# Patient Record
Sex: Male | Born: 1935 | Race: Black or African American | Hispanic: No | Smoking: Never smoker
Health system: Southern US, Community
[De-identification: ages and names within clinical notes are randomized; demographics above are authoritative.]

---

## 2018-06-22 ENCOUNTER — Ambulatory Visit (INDEPENDENT_AMBULATORY_CARE_PROVIDER_SITE_OTHER): Payer: Self-pay | Admitting: Orthopaedic Surgery

## 2018-07-06 ENCOUNTER — Ambulatory Visit (INDEPENDENT_AMBULATORY_CARE_PROVIDER_SITE_OTHER): Payer: Self-pay | Admitting: Orthopaedic Surgery

## 2018-07-13 ENCOUNTER — Ambulatory Visit (INDEPENDENT_AMBULATORY_CARE_PROVIDER_SITE_OTHER): Payer: Self-pay | Admitting: Orthopaedic Surgery

## 2018-08-16 ENCOUNTER — Ambulatory Visit (INDEPENDENT_AMBULATORY_CARE_PROVIDER_SITE_OTHER): Payer: No Typology Code available for payment source | Admitting: Orthopaedic Surgery

## 2018-08-16 ENCOUNTER — Ambulatory Visit (INDEPENDENT_AMBULATORY_CARE_PROVIDER_SITE_OTHER): Payer: PRIVATE HEALTH INSURANCE

## 2018-08-16 ENCOUNTER — Encounter (INDEPENDENT_AMBULATORY_CARE_PROVIDER_SITE_OTHER): Payer: Self-pay | Admitting: Orthopaedic Surgery

## 2018-08-16 DIAGNOSIS — G8929 Other chronic pain: Secondary | ICD-10-CM | POA: Diagnosis not present

## 2018-08-16 DIAGNOSIS — M25561 Pain in right knee: Secondary | ICD-10-CM | POA: Diagnosis not present

## 2018-08-16 NOTE — Progress Notes (Signed)
Office Visit Note   Patient: Stephen Hester           Date of Birth: 01/14/1929           MRN: [id number] Visit Date: 08/16/2018              Requested by: No referring provider defined for this encounter. PCP: Patient, No Pcp Per   Assessment & Plan: Visit Diagnoses:  1. Chronic pain of right knee     Plan: Impression is right knee arthritis exacerbation with reactive effusion.  Patient was offered an aspiration and injection but he was unable to make decisions for himself today.  His daughter was not present today.  They will follow-up at a later time to have this done.  In the meantime we have given him a hinged knee brace for support.  He will continue to rest and ice as needed. Total face to face encounter time was greater than 45 minutes and over half of this time was spent in counseling and/or coordination of care.  Follow-Up Instructions: No follow-ups on file.   Orders:  Orders Placed This Encounter  Procedures  . XR KNEE 3 VIEW RIGHT   No orders of the defined types were placed in this encounter.     Procedures: No procedures performed   Clinical Data: No additional findings.   Subjective: Chief Complaint  Patient presents with  . Right Knee - Pain    Stephen Hester is a 37 year old gentleman who presents today with his son-in-law for chronic right knee pain for approximately a year.  He complains of throbbing aching pain that causes his leg to feel like it wants to give out.  He denies any injuries.  He ambulates with a Rollator.  He really does not provide much information because of either mild dementia or trouble with memory.  His healthcare power of attorney is his daughter.   Review of Systems  Constitutional: Negative.   All other systems reviewed and are negative.    Objective: Vital Signs: There were no vitals taken for this visit.  Physical Exam Vitals signs and nursing note reviewed.  Constitutional:      Appearance: He is well-developed.  HENT:     Head: Normocephalic and atraumatic.  Eyes:     Pupils: Pupils are equal, round, and reactive to light.  Neck:     Musculoskeletal: Neck supple.  Pulmonary:     Effort: Pulmonary effort is normal.  Abdominal:     Palpations: Abdomen is soft.  Musculoskeletal: Normal range of motion.  Skin:    General: Skin is warm.  Neurological:     Mental Status: He is alert and oriented to person, place, and time.  Psychiatric:        Behavior: Behavior normal.        Thought Content: Thought content normal.        Judgment: Judgment normal.     Ortho Exam Right knee exam shows a small joint effusion.  Collaterals and cruciates are stable.  Normal range of motion. Specialty Comments:  No specialty comments available.  Imaging: Xr Knee 3 View Right  Result Date: 08/16/2018 Mild to moderate joint space narrowing consistent with osteoarthritis    PMFS History: There are no active problems to display for this patient.  History reviewed. No pertinent past medical history.  History reviewed. No pertinent family history.  History reviewed. No pertinent surgical history. Social History   Occupational History  . Not on file  Tobacco  Use  . Smoking status: Never Smoker  . Smokeless tobacco: Never Used  Substance and Sexual Activity  . Alcohol use: Not on file  . Drug use: Not on file  . Sexual activity: Not on file

## 2018-08-17 ENCOUNTER — Ambulatory Visit (INDEPENDENT_AMBULATORY_CARE_PROVIDER_SITE_OTHER): Payer: Self-pay | Admitting: Orthopaedic Surgery

## 2018-08-31 ENCOUNTER — Ambulatory Visit (INDEPENDENT_AMBULATORY_CARE_PROVIDER_SITE_OTHER): Payer: PRIVATE HEALTH INSURANCE | Admitting: Orthopaedic Surgery

## 2018-09-07 ENCOUNTER — Encounter (INDEPENDENT_AMBULATORY_CARE_PROVIDER_SITE_OTHER): Payer: Self-pay | Admitting: Orthopaedic Surgery

## 2018-09-07 ENCOUNTER — Ambulatory Visit (INDEPENDENT_AMBULATORY_CARE_PROVIDER_SITE_OTHER): Payer: PRIVATE HEALTH INSURANCE | Admitting: Orthopaedic Surgery

## 2018-09-07 DIAGNOSIS — G8929 Other chronic pain: Secondary | ICD-10-CM | POA: Diagnosis not present

## 2018-09-07 DIAGNOSIS — M1711 Unilateral primary osteoarthritis, right knee: Secondary | ICD-10-CM

## 2018-09-07 DIAGNOSIS — M25561 Pain in right knee: Secondary | ICD-10-CM | POA: Diagnosis not present

## 2018-09-07 MED ORDER — LIDOCAINE HCL 1 % IJ SOLN
2.0000 mL | INTRAMUSCULAR | Status: AC | PRN
  Administered 2018-09-07: 2 mL

## 2018-09-07 MED ORDER — METHYLPREDNISOLONE ACETATE 40 MG/ML IJ SUSP
40.0000 mg | INTRAMUSCULAR | Status: AC | PRN
  Administered 2018-09-07: 40 mg via INTRA_ARTICULAR

## 2018-09-07 MED ORDER — BUPIVACAINE HCL 0.25 % IJ SOLN
2.0000 mL | INTRAMUSCULAR | Status: AC | PRN
  Administered 2018-09-07: 2 mL via INTRA_ARTICULAR

## 2018-09-07 NOTE — Progress Notes (Signed)
   Office Visit Note   Patient: Stephen Hester           Date of Birth: 1928/09/21           MRN: [id number] Visit Date: 09/07/2018              Requested by: No referring provider defined for this encounter. PCP: Patient, No Pcp Per   Assessment & Plan: Visit Diagnoses:  1. Chronic pain of right knee     Plan: Impression is right knee osteoarthritis.  Do not believe the patient has enough of an effusion to aspirate his knee today, however we did inject it with cortisone.  We discussed viscosupplementation injection should the cortisone injection failed to relieve his symptoms.  He will follow-up with [place] as needed.  This was all discussed with his daughter who is his power of attorney.  Follow-Up Instructions: Return if symptoms worsen or fail to improve.   Orders:  Orders Placed This Encounter  Procedures  . Large Joint Inj: R knee   No orders of the defined types were placed in this encounter.     Procedures: Large Joint Inj: R knee on 09/07/2018 1:37 PM Indications: pain Details: 22 G needle, anterolateral approach Medications: 2 mL lidocaine 1 %; 2 mL bupivacaine 0.25 %; 40 mg methylPREDNISolone acetate 40 MG/ML      Clinical Data: No additional findings.   Subjective: Chief Complaint  Patient presents with  . Right Knee - Pain, Follow-up    HPI Stephen Hester is a pleasant 85 year old gentleman who presents to our clinic today with his daughter who is his power of attorney.  History of osteoarthritis to the right knee.  He was seen in our office for this just a few weeks ago.  He has been having constant throbbing pain to the right knee.  Now also occurring at rest.  He has decided along with his daughter that it is best to try an intra-articular cortisone injection to help with the pain.  Review of Systems as detailed in HPI.  All others reviewed and are negative   Objective: Vital Signs: There were no vitals taken for this visit.  Physical Exam.  Well-developed and  well-nourished gentleman in no acute distress.  Alert and oriented x3.  Ortho Exam stable exam of the right knee.  Trace effusion.  Specialty Comments:  No specialty comments available.  Imaging: No new imaging   PMFS History: Patient Active Problem List   Diagnosis Date Noted  . Chronic pain of right knee 09/07/2018   History reviewed. No pertinent past medical history.  History reviewed. No pertinent family history.  History reviewed. No pertinent surgical history. Social History   Occupational History  . Not on file  Tobacco Use  . Smoking status: Never Smoker  . Smokeless tobacco: Never Used  Substance and Sexual Activity  . Alcohol use: Not on file  . Drug use: Not on file  . Sexual activity: Not on file

## 2019-02-08 ENCOUNTER — Emergency Department (HOSPITAL_COMMUNITY): Payer: Medicare PPO

## 2019-02-08 ENCOUNTER — Inpatient Hospital Stay (HOSPITAL_COMMUNITY)
Admission: EM | Admit: 2019-02-08 | Discharge: 2019-02-25 | DRG: 193 | Disposition: E | Payer: Medicare PPO | Attending: Internal Medicine | Admitting: Internal Medicine

## 2019-02-08 DIAGNOSIS — E1110 Type 2 diabetes mellitus with ketoacidosis without coma: Secondary | ICD-10-CM | POA: Diagnosis present

## 2019-02-08 DIAGNOSIS — I480 Paroxysmal atrial fibrillation: Secondary | ICD-10-CM | POA: Diagnosis not present

## 2019-02-08 DIAGNOSIS — Y92129 Unspecified place in nursing home as the place of occurrence of the external cause: Secondary | ICD-10-CM

## 2019-02-08 DIAGNOSIS — E870 Hyperosmolality and hypernatremia: Secondary | ICD-10-CM | POA: Diagnosis present

## 2019-02-08 DIAGNOSIS — G9341 Metabolic encephalopathy: Secondary | ICD-10-CM | POA: Diagnosis present

## 2019-02-08 DIAGNOSIS — Z781 Physical restraint status: Secondary | ICD-10-CM

## 2019-02-08 DIAGNOSIS — Z515 Encounter for palliative care: Secondary | ICD-10-CM

## 2019-02-08 DIAGNOSIS — E1169 Type 2 diabetes mellitus with other specified complication: Secondary | ICD-10-CM

## 2019-02-08 DIAGNOSIS — E785 Hyperlipidemia, unspecified: Secondary | ICD-10-CM | POA: Diagnosis present

## 2019-02-08 DIAGNOSIS — E876 Hypokalemia: Secondary | ICD-10-CM | POA: Diagnosis not present

## 2019-02-08 DIAGNOSIS — D638 Anemia in other chronic diseases classified elsewhere: Secondary | ICD-10-CM | POA: Diagnosis present

## 2019-02-08 DIAGNOSIS — D72829 Elevated white blood cell count, unspecified: Secondary | ICD-10-CM

## 2019-02-08 DIAGNOSIS — R7989 Other specified abnormal findings of blood chemistry: Secondary | ICD-10-CM | POA: Diagnosis present

## 2019-02-08 DIAGNOSIS — Z7189 Other specified counseling: Secondary | ICD-10-CM

## 2019-02-08 DIAGNOSIS — Z7901 Long term (current) use of anticoagulants: Secondary | ICD-10-CM

## 2019-02-08 DIAGNOSIS — R739 Hyperglycemia, unspecified: Secondary | ICD-10-CM

## 2019-02-08 DIAGNOSIS — G9340 Encephalopathy, unspecified: Secondary | ICD-10-CM | POA: Diagnosis not present

## 2019-02-08 DIAGNOSIS — I700 Atherosclerosis of aorta: Secondary | ICD-10-CM | POA: Diagnosis present

## 2019-02-08 DIAGNOSIS — R0600 Dyspnea, unspecified: Secondary | ICD-10-CM

## 2019-02-08 DIAGNOSIS — D696 Thrombocytopenia, unspecified: Secondary | ICD-10-CM | POA: Clinically undetermined

## 2019-02-08 DIAGNOSIS — Y92009 Unspecified place in unspecified non-institutional (private) residence as the place of occurrence of the external cause: Secondary | ICD-10-CM

## 2019-02-08 DIAGNOSIS — Z79899 Other long term (current) drug therapy: Secondary | ICD-10-CM

## 2019-02-08 DIAGNOSIS — Z66 Do not resuscitate: Secondary | ICD-10-CM | POA: Diagnosis not present

## 2019-02-08 DIAGNOSIS — J189 Pneumonia, unspecified organism: Secondary | ICD-10-CM | POA: Diagnosis not present

## 2019-02-08 DIAGNOSIS — D649 Anemia, unspecified: Secondary | ICD-10-CM | POA: Diagnosis present

## 2019-02-08 DIAGNOSIS — W19XXXA Unspecified fall, initial encounter: Secondary | ICD-10-CM | POA: Diagnosis present

## 2019-02-08 DIAGNOSIS — J9601 Acute respiratory failure with hypoxia: Secondary | ICD-10-CM | POA: Diagnosis not present

## 2019-02-08 DIAGNOSIS — R569 Unspecified convulsions: Secondary | ICD-10-CM

## 2019-02-08 DIAGNOSIS — R778 Other specified abnormalities of plasma proteins: Secondary | ICD-10-CM

## 2019-02-08 DIAGNOSIS — E860 Dehydration: Secondary | ICD-10-CM | POA: Diagnosis present

## 2019-02-08 DIAGNOSIS — R9431 Abnormal electrocardiogram [ECG] [EKG]: Secondary | ICD-10-CM

## 2019-02-08 DIAGNOSIS — I7389 Other specified peripheral vascular diseases: Secondary | ICD-10-CM | POA: Diagnosis present

## 2019-02-08 DIAGNOSIS — I119 Hypertensive heart disease without heart failure: Secondary | ICD-10-CM | POA: Diagnosis present

## 2019-02-08 DIAGNOSIS — Z20828 Contact with and (suspected) exposure to other viral communicable diseases: Secondary | ICD-10-CM | POA: Diagnosis present

## 2019-02-08 DIAGNOSIS — Z8673 Personal history of transient ischemic attack (TIA), and cerebral infarction without residual deficits: Secondary | ICD-10-CM

## 2019-02-08 DIAGNOSIS — R4182 Altered mental status, unspecified: Secondary | ICD-10-CM

## 2019-02-08 DIAGNOSIS — I959 Hypotension, unspecified: Secondary | ICD-10-CM | POA: Diagnosis not present

## 2019-02-08 DIAGNOSIS — I10 Essential (primary) hypertension: Secondary | ICD-10-CM

## 2019-02-08 DIAGNOSIS — R0602 Shortness of breath: Secondary | ICD-10-CM

## 2019-02-08 DIAGNOSIS — R509 Fever, unspecified: Secondary | ICD-10-CM

## 2019-02-08 DIAGNOSIS — R062 Wheezing: Secondary | ICD-10-CM

## 2019-02-08 DIAGNOSIS — E878 Other disorders of electrolyte and fluid balance, not elsewhere classified: Secondary | ICD-10-CM | POA: Diagnosis present

## 2019-02-08 DIAGNOSIS — G3183 Dementia with Lewy bodies: Secondary | ICD-10-CM

## 2019-02-08 DIAGNOSIS — R5381 Other malaise: Secondary | ICD-10-CM

## 2019-02-08 DIAGNOSIS — R21 Rash and other nonspecific skin eruption: Secondary | ICD-10-CM | POA: Diagnosis not present

## 2019-02-08 DIAGNOSIS — N179 Acute kidney failure, unspecified: Secondary | ICD-10-CM

## 2019-02-08 DIAGNOSIS — T827XXA Infection and inflammatory reaction due to other cardiac and vascular devices, implants and grafts, initial encounter: Secondary | ICD-10-CM

## 2019-02-08 DIAGNOSIS — Z794 Long term (current) use of insulin: Secondary | ICD-10-CM

## 2019-02-08 DIAGNOSIS — F0280 Dementia in other diseases classified elsewhere without behavioral disturbance: Secondary | ICD-10-CM | POA: Diagnosis present

## 2019-02-08 DIAGNOSIS — I4811 Longstanding persistent atrial fibrillation: Secondary | ICD-10-CM

## 2019-02-08 LAB — CBC
HCT: 36.8 % — ABNORMAL LOW (ref 39.0–52.0)
Hemoglobin: 11.5 g/dL — ABNORMAL LOW (ref 13.0–17.0)
MCH: 31.3 pg (ref 26.0–34.0)
MCHC: 31.3 g/dL (ref 30.0–36.0)
MCV: 100.3 fL — ABNORMAL HIGH (ref 80.0–100.0)
Platelets: 115 10*3/uL — ABNORMAL LOW (ref 150–400)
RBC: 3.67 MIL/uL — ABNORMAL LOW (ref 4.22–5.81)
RDW: 13.1 % (ref 11.5–15.5)
WBC: 7.7 10*3/uL (ref 4.0–10.5)
nRBC: 0 % (ref 0.0–0.2)

## 2019-02-08 LAB — BASIC METABOLIC PANEL
Anion gap: 16 — ABNORMAL HIGH (ref 5–15)
Anion gap: 10 (ref 5–15)
BUN: 60 mg/dL — ABNORMAL HIGH (ref 8–23)
BUN: 63 mg/dL — ABNORMAL HIGH (ref 8–23)
CO2: 17 mmol/L — ABNORMAL LOW (ref 22–32)
CO2: 23 mmol/L (ref 22–32)
Calcium: 9.2 mg/dL (ref 8.9–10.3)
Calcium: 8.9 mg/dL (ref 8.9–10.3)
Chloride: 119 mmol/L — ABNORMAL HIGH (ref 98–111)
Chloride: 118 mmol/L — ABNORMAL HIGH (ref 98–111)
Creatinine, Ser: 2.99 mg/dL — ABNORMAL HIGH (ref 0.61–1.24)
Creatinine, Ser: 2.75 mg/dL — ABNORMAL HIGH (ref 0.61–1.24)
GFR calc Af Amer: 21 mL/min — ABNORMAL LOW (ref >60)
GFR calc Af Amer: 23 mL/min — ABNORMAL LOW (ref >60)
GFR calc non Af Amer: 18 mL/min — ABNORMAL LOW (ref >60)
GFR calc non Af Amer: 20 mL/min — ABNORMAL LOW (ref >60)
Glucose, Bld: 235 mg/dL — ABNORMAL HIGH (ref 70–99)
Glucose, Bld: 256 mg/dL — ABNORMAL HIGH (ref 70–99)
Potassium: 4.8 mmol/L (ref 3.5–5.1)
Potassium: 4 mmol/L (ref 3.5–5.1)
Sodium: 152 mmol/L — ABNORMAL HIGH (ref 135–145)
Sodium: 151 mmol/L — ABNORMAL HIGH (ref 135–145)

## 2019-02-08 LAB — URINALYSIS, ROUTINE W REFLEX MICROSCOPIC
Bilirubin Urine: NEGATIVE
Glucose, UA: >=500 mg/dL — AB
Ketones, ur: NEGATIVE mg/dL
Leukocytes,Ua: NEGATIVE
Nitrite: NEGATIVE
Protein, ur: 30 mg/dL — AB
Specific Gravity, Urine: 1.016 (ref 1.005–1.030)
pH: 5 (ref 5.0–8.0)

## 2019-02-08 LAB — HEPATIC FUNCTION PANEL
ALT: 15 U/L (ref 0–44)
AST: 37 U/L (ref 15–41)
Albumin: 3.9 g/dL (ref 3.5–5.0)
Alkaline Phosphatase: 65 U/L (ref 38–126)
Bilirubin, Direct: 0.2 mg/dL (ref 0.0–0.2)
Indirect Bilirubin: 0.2 mg/dL — ABNORMAL LOW (ref 0.3–0.9)
Total Bilirubin: 0.4 mg/dL (ref 0.3–1.2)
Total Protein: 6.6 g/dL (ref 6.5–8.1)

## 2019-02-08 LAB — CBC WITH DIFFERENTIAL/PLATELET
Abs Immature Granulocytes: 0.05 10*3/uL (ref 0.00–0.07)
Basophils Absolute: 0 10*3/uL (ref 0.0–0.1)
Basophils Relative: 0 %
Eosinophils Absolute: 0 10*3/uL (ref 0.0–0.5)
Eosinophils Relative: 0 %
HCT: 41.3 % (ref 39.0–52.0)
Hemoglobin: 12.7 g/dL — ABNORMAL LOW (ref 13.0–17.0)
Immature Granulocytes: 1 %
Lymphocytes Relative: 21 %
Lymphs Abs: 1.7 10*3/uL (ref 0.7–4.0)
MCH: 30.8 pg (ref 26.0–34.0)
MCHC: 30.8 g/dL (ref 30.0–36.0)
MCV: 100.2 fL — ABNORMAL HIGH (ref 80.0–100.0)
Monocytes Absolute: 0.5 10*3/uL (ref 0.1–1.0)
Monocytes Relative: 6 %
Neutro Abs: 6.1 10*3/uL (ref 1.7–7.7)
Neutrophils Relative %: 72 %
Platelets: 124 10*3/uL — ABNORMAL LOW (ref 150–400)
RBC: 4.12 MIL/uL — ABNORMAL LOW (ref 4.22–5.81)
RDW: 13.1 % (ref 11.5–15.5)
WBC: 8.4 10*3/uL (ref 4.0–10.5)
nRBC: 0 % (ref 0.0–0.2)

## 2019-02-08 LAB — CK: Total CK: 607 U/L — ABNORMAL HIGH (ref 49–397)

## 2019-02-08 LAB — TROPONIN I (HIGH SENSITIVITY)
Troponin I (High Sensitivity): 36 ng/L — ABNORMAL HIGH (ref <18)
Troponin I (High Sensitivity): 50 ng/L — ABNORMAL HIGH (ref <18)
Troponin I (High Sensitivity): 53 ng/L — ABNORMAL HIGH (ref <18)

## 2019-02-08 LAB — CREATININE, SERUM
Creatinine, Ser: 2.78 mg/dL — ABNORMAL HIGH (ref 0.61–1.24)
GFR calc Af Amer: 23 mL/min — ABNORMAL LOW (ref >60)
GFR calc non Af Amer: 20 mL/min — ABNORMAL LOW (ref >60)

## 2019-02-08 LAB — MAGNESIUM
Magnesium: 2.3 mg/dL (ref 1.7–2.4)
Magnesium: 2.1 mg/dL (ref 1.7–2.4)

## 2019-02-08 LAB — GLUCOSE, CAPILLARY: Glucose-Capillary: 203 mg/dL — ABNORMAL HIGH (ref 70–99)

## 2019-02-08 LAB — TSH: TSH: 0.696 u[IU]/mL (ref 0.350–4.500)

## 2019-02-08 LAB — SARS CORONAVIRUS 2 BY RT PCR (HOSPITAL ORDER, PERFORMED IN ~~LOC~~ HOSPITAL LAB): SARS Coronavirus 2: NEGATIVE

## 2019-02-08 MED ORDER — HEPARIN SODIUM (PORCINE) 5000 UNIT/ML IJ SOLN
5000.0000 [IU] | Freq: Three times a day (TID) | INTRAMUSCULAR | Status: DC
  Administered 2019-02-08: 22:00:00 5000 [IU] via SUBCUTANEOUS
  Filled 2019-02-08: qty 1

## 2019-02-08 MED ORDER — INSULIN ASPART 100 UNIT/ML ~~LOC~~ SOLN
0.0000 [IU] | Freq: Three times a day (TID) | SUBCUTANEOUS | Status: DC
  Administered 2019-02-09 (×2): 2 [IU] via SUBCUTANEOUS
  Administered 2019-02-09: 3 [IU] via SUBCUTANEOUS
  Administered 2019-02-10: 2 [IU] via SUBCUTANEOUS
  Administered 2019-02-10: 3 [IU] via SUBCUTANEOUS
  Administered 2019-02-11 (×2): 2 [IU] via SUBCUTANEOUS
  Administered 2019-02-11: 13:00:00 3 [IU] via SUBCUTANEOUS
  Administered 2019-02-12: 5 [IU] via SUBCUTANEOUS
  Administered 2019-02-12 (×2): 3 [IU] via SUBCUTANEOUS
  Administered 2019-02-13 (×2): 2 [IU] via SUBCUTANEOUS
  Administered 2019-02-13: 08:00:00 1 [IU] via SUBCUTANEOUS
  Administered 2019-02-14 (×3): 2 [IU] via SUBCUTANEOUS
  Administered 2019-02-17: 08:00:00 1 [IU] via SUBCUTANEOUS
  Administered 2019-02-17: 12:00:00 2 [IU] via SUBCUTANEOUS
  Administered 2019-02-18 (×2): 5 [IU] via SUBCUTANEOUS
  Administered 2019-02-18: 12:00:00 7 [IU] via SUBCUTANEOUS
  Administered 2019-02-19 – 2019-02-20 (×2): 1 [IU] via SUBCUTANEOUS
  Administered 2019-02-21 (×3): 3 [IU] via SUBCUTANEOUS
  Administered 2019-02-22: 07:00:00 5 [IU] via SUBCUTANEOUS

## 2019-02-08 MED ORDER — POLYETHYLENE GLYCOL 3350 17 G PO PACK: 17.0000 g | PACK | Freq: Every day | ORAL | Status: DC | PRN

## 2019-02-08 MED ORDER — ACETAMINOPHEN 325 MG PO TABS
650.0000 mg | ORAL_TABLET | Freq: Four times a day (QID) | ORAL | Status: DC | PRN
  Administered 2019-02-16: 23:00:00 650 mg via ORAL
  Filled 2019-02-08 (×2): qty 2

## 2019-02-08 MED ORDER — SODIUM CHLORIDE 0.9 % IV BOLUS
1000.0000 mL | Freq: Once | INTRAVENOUS | Status: AC
  Administered 2019-02-08: 12:00:00 1000 mL via INTRAVENOUS

## 2019-02-08 MED ORDER — ACETAMINOPHEN 650 MG RE SUPP: 650.0000 mg | Freq: Four times a day (QID) | RECTAL | Status: DC | PRN

## 2019-02-08 MED ORDER — INSULIN DETEMIR 100 UNIT/ML ~~LOC~~ SOLN
10.0000 [IU] | Freq: Two times a day (BID) | SUBCUTANEOUS | Status: DC
  Administered 2019-02-08 – 2019-02-16 (×16): 10 [IU] via SUBCUTANEOUS
  Filled 2019-02-08 (×17): qty 0.1

## 2019-02-08 MED ORDER — SODIUM CHLORIDE 0.45 % IV SOLN
INTRAVENOUS | Status: DC
  Administered 2019-02-08: 18:00:00 via INTRAVENOUS

## 2019-02-08 MED ORDER — INSULIN ASPART 100 UNIT/ML ~~LOC~~ SOLN
10.0000 [IU] | Freq: Once | SUBCUTANEOUS | Status: DC
  Filled 2019-02-08: qty 0.1

## 2019-02-08 MED ORDER — SENNA 8.6 MG PO TABS
1.0000 | ORAL_TABLET | Freq: Two times a day (BID) | ORAL | Status: DC
  Administered 2019-02-09 – 2019-02-16 (×14): 8.6 mg via ORAL
  Filled 2019-02-08 (×14): qty 1

## 2019-02-08 MED ORDER — INSULIN ASPART 100 UNIT/ML ~~LOC~~ SOLN
0.0000 [IU] | Freq: Every day | SUBCUTANEOUS | Status: DC
  Administered 2019-02-08 – 2019-02-09 (×2): 2 [IU] via SUBCUTANEOUS
  Administered 2019-02-10: 3 [IU] via SUBCUTANEOUS
  Administered 2019-02-11: 22:00:00 2 [IU] via SUBCUTANEOUS
  Administered 2019-02-13: 22:00:00 4 [IU] via SUBCUTANEOUS
  Administered 2019-02-17: 22:00:00 2 [IU] via SUBCUTANEOUS

## 2019-02-08 NOTE — ED Notes (Signed)
ED TO INPATIENT HANDOFF REPORT  ED Nurse Name and Phone #: Candyce Churn Name/Age/Gender Stephen Hester. 85 y.o. male Room/Bed: WA04/WA04  Code Status   Code Status: Not on file  Home/SNF/Other Given to floor Patient oriented to: self Is this baseline? Yes   Triage Complete: Triage complete  Chief Complaint altered mental status  Triage Note Patient is from [place] where he had an unwitnessed fall and 0800 today. He was last seen normal at 0730. He has been brought in for altered mental status. He can carry on a conversation and can walk with a walker. EMS reported strong urine and he has a history of UTIs. EMS also reports the patient was too altered to do a stroke screen. He currently is not on blood thinners. 500 cc of fluid was administer by EMS.    EMS vitals and CBG:  118/68 BP 60 HR 378 CBG 14 Resp rate 95% O2 sat on room air   Allergies No Known Allergies  Level of Care/Admitting Diagnosis ED Disposition    ED Disposition Condition Comment   Admit  Hospital Area: [facility] [[id number]]  Level of Care: Telemetry [5]  Admit to tele based on following criteria: Other see comments  Comments: Acute encephalopathy  Covid Evaluation: Confirmed COVID Negative  Diagnosis: Acute encephalopathy [952841]  Admitting Physician: Almon Hercules [[id number]]  Attending Physician: Almon Hercules [[id number]]  PT Class (Do Not Modify): Observation [104]  PT Acc Code (Do Not Modify): Observation [10022]       B Medical/Surgery History No past medical history on file. No past surgical history on file.   A IV Location/Drains/Wounds Patient Lines/Drains/Airways Status   Active Line/Drains/Airways    None          Intake/Output Last 24 hours  Intake/Output Summary (Last 24 hours) at 02-24-19 1656 Last data filed at February 24, 2019 1229 Gross per 24 hour  Intake -  Output 550 ml  Net -550 ml    Labs/Imaging Results for orders placed or performed  during the hospital encounter of 02-24-2019 (from the past 48 hour(s))  Urinalysis, Routine w reflex microscopic     Status: Abnormal   Collection Time: Feb 24, 2019 12:28 PM  Result Value Ref Range   Color, Urine YELLOW YELLOW   APPearance HAZY (A) CLEAR   Specific Gravity, Urine 1.016 1.005 - 1.030   pH 5.0 5.0 - 8.0   Glucose, UA >=500 (A) NEGATIVE mg/dL   Hgb urine dipstick SMALL (A) NEGATIVE   Bilirubin Urine NEGATIVE NEGATIVE   Ketones, ur NEGATIVE NEGATIVE mg/dL   Protein, ur 30 (A) NEGATIVE mg/dL   Nitrite NEGATIVE NEGATIVE   Leukocytes,Ua NEGATIVE NEGATIVE   RBC / HPF 0-5 0 - 5 RBC/hpf   WBC, UA 0-5 0 - 5 WBC/hpf   Bacteria, UA RARE (A) NONE SEEN   Mucus PRESENT    Hyaline Casts, UA PRESENT     Comment: Performed at [facility], [address] [place], [place] [address]  CBC with Differential     Status: Abnormal   Collection Time: Feb 24, 2019 12:40 PM  Result Value Ref Range   WBC 8.4 4.0 - 10.5 K/uL   RBC 4.12 (L) 4.22 - 5.81 MIL/uL   Hemoglobin 12.7 (L) 13.0 - 17.0 g/dL   HCT 74.2 59.5 - 63.8 %   MCV 100.2 (H) 80.0 - 100.0 fL   MCH 30.8 26.0 - 34.0 pg   MCHC 30.8 30.0 - 36.0 g/dL  RDW 13.1 11.5 - 15.5 %   Platelets 124 (L) 150 - 400 K/uL   nRBC 0.0 0.0 - 0.2 %   Neutrophils Relative % 72 %   Neutro Abs 6.1 1.7 - 7.7 K/uL   Lymphocytes Relative 21 %   Lymphs Abs 1.7 0.7 - 4.0 K/uL   Monocytes Relative 6 %   Monocytes Absolute 0.5 0.1 - 1.0 K/uL   Eosinophils Relative 0 %   Eosinophils Absolute 0.0 0.0 - 0.5 K/uL   Basophils Relative 0 %   Basophils Absolute 0.0 0.0 - 0.1 K/uL   Immature Granulocytes 1 %   Abs Immature Granulocytes 0.05 0.00 - 0.07 K/uL    Comment: Performed at [facility], [place] [address] [place], [place] [address]  Basic metabolic panel     Status: Abnormal   Collection Time: 01/28/2019 12:40 PM  Result Value Ref Range   Sodium 152 (H) 135 - 145 mmol/L   Potassium 4.8 3.5 - 5.1 mmol/L   Chloride 119 (H) 98  - 111 mmol/L   CO2 17 (L) 22 - 32 mmol/L   Glucose, Bld 235 (H) 70 - 99 mg/dL   BUN 60 (H) 8 - 23 mg/dL   Creatinine, Ser 2.99 (H) 0.61 - 1.24 mg/dL   Calcium 9.2 8.9 - 10.3 mg/dL   GFR calc non Af Amer 18 (L) >60 mL/min   GFR calc Af Amer 21 (L) >60 mL/min   Anion gap 16 (H) 5 - 15    Comment: Performed at [facility], [place] [address] [place], [place] [address]  Hepatic function panel     Status: Abnormal   Collection Time: 02/17/2019 12:40 PM  Result Value Ref Range   Total Protein 6.6 6.5 - 8.1 g/dL   Albumin 3.9 3.5 - 5.0 g/dL   AST 37 15 - 41 U/L   ALT 15 0 - 44 U/L   Alkaline Phosphatase 65 38 - 126 U/L   Total Bilirubin 0.4 0.3 - 1.2 mg/dL   Bilirubin, Direct 0.2 0.0 - 0.2 mg/dL   Indirect Bilirubin 0.2 (L) 0.3 - 0.9 mg/dL    Comment: Performed at [facility], [place] [address] [place], [place] [address]  Magnesium     Status: None   Collection Time: 02/02/2019 12:40 PM  Result Value Ref Range   Magnesium 2.3 1.7 - 2.4 mg/dL    Comment: Performed at [facility], [place] [address], [place], [place] [address]  Troponin I (High Sensitivity)     Status: Abnormal   Collection Time: 02/16/2019 12:40 PM  Result Value Ref Range   Troponin I (High Sensitivity) 36 (H) <18 ng/L    Comment: (NOTE) Elevated high sensitivity troponin I (hsTnI) values and significant  changes across serial measurements may suggest ACS but many other  chronic and acute conditions are known to elevate hsTnI results.  Refer to the "Links" section for chest pain algorithms and additional  guidance. Performed at [facility], [place] [address] [place], [place] [address]   SARS Coronavirus 2 (CEPHEID- Performed in [place] hospital lab), Hosp Order     Status: None   Collection Time: 01/28/2019  1:14 PM   Specimen: Nasopharyngeal Swab  Result Value Ref Range   SARS Coronavirus 2 NEGATIVE NEGATIVE    Comment: (NOTE) If result is  NEGATIVE SARS-CoV-2 target nucleic acids are NOT DETECTED. The SARS-CoV-2 RNA is generally detectable in upper and lower  respiratory specimens during  the acute phase of infection. The lowest  concentration of SARS-CoV-2 viral copies this assay can detect is 250  copies / mL. A negative result does not preclude SARS-CoV-2 infection  and should not be used as the sole basis for treatment or other  patient management decisions.  A negative result may occur with  improper specimen collection / handling, submission of specimen other  than nasopharyngeal swab, presence of viral mutation(s) within the  areas targeted by this assay, and inadequate number of viral copies  (<250 copies / mL). A negative result must be combined with clinical  observations, patient history, and epidemiological information. If result is POSITIVE SARS-CoV-2 target nucleic acids are DETECTED. The SARS-CoV-2 RNA is generally detectable in upper and lower  respiratory specimens dur ing the acute phase of infection.  Positive  results are indicative of active infection with SARS-CoV-2.  Clinical  correlation with patient history and other diagnostic information is  necessary to determine patient infection status.  Positive results do  not rule out bacterial infection or co-infection with other viruses. If result is PRESUMPTIVE POSTIVE SARS-CoV-2 nucleic acids MAY BE PRESENT.   A presumptive positive result was obtained on the submitted specimen  and confirmed on repeat testing.  While 2019 novel coronavirus  (SARS-CoV-2) nucleic acids may be present in the submitted sample  additional confirmatory testing may be necessary for epidemiological  and / or clinical management purposes  to differentiate between  SARS-CoV-2 and other Sarbecovirus currently known to infect humans.  If clinically indicated additional testing with an alternate test  methodology [phone] is advised. The SARS-CoV-2 RNA is generally  detectable  in upper and lower respiratory sp ecimens during the acute  phase of infection. The expected result is Negative. Fact Sheet for Patients:  BoilerBrush.com.cyhttps://www.fda.gov/media/136312/download Fact Sheet for Healthcare Providers: https://pope.com/https://www.fda.gov/media/136313/download This test is not yet approved or cleared by the [place] FDA and has been authorized for detection and/or diagnosis of SARS-CoV-2 by FDA under an Emergency Use Authorization (EUA).  This EUA will remain in effect (meaning this test can be used) for the duration of the COVID-19 declaration under Section 564(b)(1) of the Act, 21 U.S.C. section 360bbb-3(b)(1), unless the authorization is terminated or revoked sooner. Performed at [facility], [address] [place], [place] [address]    Ct Head Wo Contrast  Result Date: 01/25/2019 CLINICAL DATA:  Unwitnessed fall.  Altered mental status. EXAM: CT HEAD WITHOUT CONTRAST CT CERVICAL SPINE WITHOUT CONTRAST TECHNIQUE: Multidetector CT imaging of the head and cervical spine was performed following the standard protocol without intravenous contrast. Multiplanar CT image reconstructions of the cervical spine were also generated. COMPARISON:  None. FINDINGS: CT HEAD FINDINGS Brain: Mild diffuse cortical atrophy is noted. Mild chronic ischemic white matter disease is noted. 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 No mass effect or midline shift is noted. Ventricular size is within normal limits. There is no evidence of mass lesion, hemorrhage or acute infarction. Vascular: No hyperdense vessel or unexpected calcification. Skull: Normal. Negative for fracture or focal lesion. Sinuses/Orbits: No acute finding. Other: None. CT CERVICAL SPINE FINDINGS Alignment: Normal. Skull base and vertebrae: No acute fracture. No primary bone lesion or focal pathologic process. Soft tissues and spinal canal: No prevertebral fluid or swelling. No visible canal hematoma. Disc levels: Severe  degenerative disc disease is noted at C3-4, C4-5, C5-6, C6-7 and C7-T1 with anterior osteophyte formation. Upper chest: Negative. Other: None. IMPRESSION: Mild diffuse cortical atrophy. Mild chronic ischemic white matter disease. No  acute intracranial abnormality seen. Severe multilevel degenerative disc disease. No acute abnormality seen in the cervical spine. Electronically Signed   By: Lupita RaiderJames  Green Jr M.D.   On: 02/11/2019 13:46   Ct Cervical Spine Wo Contrast  Result Date: 01/29/2019 CLINICAL DATA:  Unwitnessed fall.  Altered mental status. EXAM: CT HEAD WITHOUT CONTRAST CT CERVICAL SPINE WITHOUT CONTRAST TECHNIQUE: Multidetector CT imaging of the head and cervical spine was performed following the standard protocol without intravenous contrast. Multiplanar CT image reconstructions of the cervical spine were also generated. COMPARISON:  None. FINDINGS: CT HEAD FINDINGS Brain: Mild diffuse cortical atrophy is noted. Mild chronic ischemic white matter disease is noted. 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 No mass effect or midline shift is noted. Ventricular size is within normal limits. There is no evidence of mass lesion, hemorrhage or acute infarction. Vascular: No hyperdense vessel or unexpected calcification. Skull: Normal. Negative for fracture or focal lesion. Sinuses/Orbits: No acute finding. Other: None. CT CERVICAL SPINE FINDINGS Alignment: Normal. Skull base and vertebrae: No acute fracture. No primary bone lesion or focal pathologic process. Soft tissues and spinal canal: No prevertebral fluid or swelling. No visible canal hematoma. Disc levels: Severe degenerative disc disease is noted at C3-4, C4-5, C5-6, C6-7 and C7-T1 with anterior osteophyte formation. Upper chest: Negative. Other: None. IMPRESSION: Mild diffuse cortical atrophy. Mild chronic ischemic white matter disease. No acute intracranial abnormality seen. Severe multilevel degenerative disc disease. No acute abnormality seen  in the cervical spine. Electronically Signed   By: Lupita RaiderJames  Green Jr M.D.   On: 02/21/2019 13:46   Dg Chest Portable 1 View  Result Date: 02/20/2019 CLINICAL DATA:  Unwitnessed fall.  Altered mental status. EXAM: PORTABLE CHEST 1 VIEW COMPARISON:  None. FINDINGS: The heart size and mediastinal contours are within normal limits. Both lungs are clear. The visualized skeletal structures are unremarkable. IMPRESSION: No active disease. Electronically Signed   By: Duanne GuessNicholas  Plundo M.D.   On: 02/21/2019 15:09    Pending Labs Unresulted Labs (From admission, onward)    Start     Ordered   02/21/2019 1439  CK  Once,   STAT     01/27/2019 1439   02/17/2019 1132  Urine culture  ONCE - STAT,   STAT     02/07/2019 1131   Signed and Held  CBC  (heparin)  Once,   R    Comments: Baseline for heparin therapy IF NOT ALREADY DRAWN.  Notify MD if PLT < 100 K.    Signed and Held   Signed and Held  Creatinine, serum  (heparin)  Once,   R    Comments: Baseline for heparin therapy IF NOT ALREADY DRAWN.    Signed and Held   Signed and Held  Magnesium  Once,   R     Signed and Held   Signed and Held  TSH  Once,   R     Signed and Held   Signed and Held  Basic metabolic panel  Tomorrow morning,   R     Signed and Held   Signed and Held  CBC  Tomorrow morning,   R     Signed and Held   Signed and Held  Hemoglobin A1c  Tomorrow morning,   R     Signed and Held          Vitals/Pain Today's Vitals   02/20/2019 1230 02/12/2019 1310 02/16/2019 1330 01/26/2019 1540  BP: (!) 142/74 (!) 145/76 108/88 (!) 153/67  Pulse:  70  Resp:    16  Temp:      TempSrc:      SpO2:    100%    Isolation Precautions Airborne and Contact precautions  Medications Medications  insulin aspart (novoLOG) injection 10 Units (has no administration in time range)  0.45 % sodium chloride infusion (has no administration in time range)  sodium chloride 0.9 % bolus 1,000 mL (0 mLs Intravenous Stopped 02/13/2019 1404)     Mobility non-ambulatory High fall risk   Focused Assessments Neuro Assessment Handoff:  Swallow screen pass? Yes          Neuro Assessment:   Neuro Checks:      Last Documented NIHSS Modified Score:   Has TPA been given? No If patient is a Neuro Trauma and patient is going to OR before floor call report to 4N Charge nurse: [phone] or [phone]     R Recommendations: See Admitting Provider Note  Report given to:   Additional Notes:

## 2019-02-08 NOTE — H&P (Signed)
History and Physical    Stephen MuslimAlex Leak Jr. XLK:[id number] DOB: July 27, 1929 DOA: 02/03/2019  PCP: [facility] Patient coming from: [facility], [facility].  Can carry on conversation and walk with a walker at baseline.  Chief Complaint: Altered mental status  HPI: Stephen Muslimlex Leak Jr. is a 85 y.o. male with history of diabetes, dementia, hypertension and A. fib on Eliquis presenting with unwitnessed fall and altered mental status.   Patient was not able to provide history due to encephalopathy/dementia.  History based on chart review and discussion with EDP.  Minimally follows command.  Does not appear to be in distress.  Trying to get out of the bed.  Responds no to pain.  Called and talked to patient's daughter who had a call from facility saying he had unwitnessed fall.  She was told his eyes were rolling backward.  She did not have more detail.  She confirms with me that he is full code.  Per EMS, not able to do stroke screen due to encephalopathy.  Was given 500 cc normal saline bolus.  Vital signs not impressive.  CBG elevated to 378.  In ED, hemodynamically stable.  100% on room air.  Hgb 12.7.  MCV 100.  Platelet 124.  Sodium 152.  Bicarb 17.  Glucose 235.  BUN 60.  Creatinine 2.99 (no baseline to compare to).  Anion gap 16.  Otherwise CMP not impressive.  High-sensitivity troponin 36.  EKG with sinus rhythm, PVCs, TW bilaterally and QTC to 580s.  Urinalysis with glucose greater than 500.  No ketones.  Portable CXR, CT head and cervical spine without acute finding.  COVID-19 negative.  Was given normal saline bolus 1 L and hospitalist service was called for admission for encephalopathy and fall.  ROS Not able to obtain review of systems due to patient's mental status. PMH As in HPI. PSH Not able to obtain due to patient's encephalopathy. Fam HX No family history on file. Not able to obtain due to patient's encephalopathy. Social Hx  reports that he has never smoked. He has never  used smokeless tobacco. No history on file for alcohol and drug. From [facility] facility.  Ambulates using walker at baseline per triage nurse. Allergy No Known Allergies Home Meds Prior to Admission medications   Medication Sig Start Date End Date Taking? Authorizing Provider  ELIQUIS 5 MG TABS tablet Take 5 mg by mouth 2 (two) times daily. 07/30/18  Yes [provider]  insulin aspart (NOVOLOG) 100 UNIT/ML injection Inject 5 Units into the skin 2 (two) times a day. with breakfast and dinner   Yes [provider]  insulin detemir (LEVEMIR) 100 UNIT/ML injection Inject 27 Units into the skin at bedtime.   Yes [provider]  ipratropium-albuterol (DUONEB) 0.5-2.5 (3) MG/3ML SOLN Take 3 mLs by nebulization every 8 (eight) hours as needed (wheezing or shortness of breath).   Yes [provider]  lisinopril (ZESTRIL) 10 MG tablet Take 10 mg by mouth daily.   Yes [provider]  loperamide (IMODIUM) 2 MG capsule Take 2 mg by mouth every 6 (six) hours as needed for diarrhea or loose stools.   Yes [provider]  magnesium oxide (MAG-OX) 400 MG tablet Take 400 mg by mouth every other day.   Yes [provider]  Melatonin 3 MG TABS Take 6 mg by mouth at bedtime.  05/12/18  Yes [provider]  metoprolol succinate (TOPROL-XL) 100 MG 24 hr tablet Take 50 mg by mouth daily. Take  with or immediately following a meal.   Yes [provider]  rosuvastatin (CRESTOR) 10 MG tablet Take 10 mg by mouth at bedtime.   Yes [provider]  sertraline (ZOLOFT) 25 MG tablet Take 25 mg by mouth at bedtime.   Yes [provider]  spironolactone (ALDACTONE) 25 MG tablet Take 12.5 mg by mouth daily.   Yes [provider]  vitamin B-12 (CYANOCOBALAMIN) 1000 MCG tablet Take 1,000 mcg by mouth daily.   Yes [provider]    Physical Exam: Vitals:   02/19/2019 1230 02/13/2019 1310 02/07/2019 1330 02/15/2019 1540    BP: (!) 142/74 (!) 145/76 108/88 (!) 153/67  Pulse:    70  Resp:    16  Temp:      TempSrc:      SpO2:    100%    GENERAL: No acute distress.  Attempted to get up in the bed. HEENT: MMM.  Vision and hearing grossly intact.  NECK: Supple.  No apparent JVD.  RESP:  No IWOB. Good air movement bilaterally. CVS:  RRR. Heart sounds normal.  ABD/GI/GU: Bowel sounds present. Soft. Non tender.  MSK/EXT:  Moves extremities. No apparent deformity or edema.  SKIN: no apparent skin lesion or wound NEURO: Somewhat sleepy but opens eyes to voice.  Attempts to get out of the bed.  PERRL.  Grip strength symmetric.  Moves all extremities symmetrically.  Patellar reflex symmetric.  Further exam limited due to patient's inability to cooperate.  Personally Reviewed Radiological Exams Ct Head Wo Contrast  Result Date: 01/26/2019 CLINICAL DATA:  Unwitnessed fall.  Altered mental status. EXAM: CT HEAD WITHOUT CONTRAST CT CERVICAL SPINE WITHOUT CONTRAST TECHNIQUE: Multidetector CT imaging of the head and cervical spine was performed following the standard protocol without intravenous contrast. Multiplanar CT image reconstructions of the cervical spine were also generated. COMPARISON:  None. FINDINGS: CT HEAD FINDINGS Brain: Mild diffuse cortical atrophy is noted. Mild chronic ischemic white matter disease is noted. 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 No mass effect or midline shift is noted. Ventricular size is within normal limits. There is no evidence of mass lesion, hemorrhage or acute infarction. Vascular: No hyperdense vessel or unexpected calcification. Skull: Normal. Negative for fracture or focal lesion. Sinuses/Orbits: No acute finding. Other: None. CT CERVICAL SPINE FINDINGS Alignment: Normal. Skull base and vertebrae: No acute fracture. No primary bone lesion or focal pathologic process. Soft tissues and spinal canal: No prevertebral fluid or swelling. No visible canal hematoma. Disc levels:  Severe degenerative disc disease is noted at C3-4, C4-5, C5-6, C6-7 and C7-T1 with anterior osteophyte formation. Upper chest: Negative. Other: None. IMPRESSION: Mild diffuse cortical atrophy. Mild chronic ischemic white matter disease. No acute intracranial abnormality seen. Severe multilevel degenerative disc disease. No acute abnormality seen in the cervical spine. Electronically Signed   By: Lupita RaiderJames  Green Jr M.D.   On: 02/12/2019 13:46   Ct Cervical Spine Wo Contrast  Result Date: 02/04/2019 CLINICAL DATA:  Unwitnessed fall.  Altered mental status. EXAM: CT HEAD WITHOUT CONTRAST CT CERVICAL SPINE WITHOUT CONTRAST TECHNIQUE: Multidetector CT imaging of the head and cervical spine was performed following the standard protocol without intravenous contrast. Multiplanar CT image reconstructions of the cervical spine were also generated. COMPARISON:  None. FINDINGS: CT HEAD FINDINGS Brain: Mild diffuse cortical atrophy is noted. Mild chronic ischemic white matter disease is noted. 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 No mass effect or midline shift is noted. Ventricular size is within normal limits. There is  no evidence of mass lesion, hemorrhage or acute infarction. Vascular: No hyperdense vessel or unexpected calcification. Skull: Normal. Negative for fracture or focal lesion. Sinuses/Orbits: No acute finding. Other: None. CT CERVICAL SPINE FINDINGS Alignment: Normal. Skull base and vertebrae: No acute fracture. No primary bone lesion or focal pathologic process. Soft tissues and spinal canal: No prevertebral fluid or swelling. No visible canal hematoma. Disc levels: Severe degenerative disc disease is noted at C3-4, C4-5, C5-6, C6-7 and C7-T1 with anterior osteophyte formation. Upper chest: Negative. Other: None. IMPRESSION: Mild diffuse cortical atrophy. Mild chronic ischemic white matter disease. No acute intracranial abnormality seen. Severe multilevel degenerative disc disease. No acute abnormality  seen in the cervical spine. Electronically Signed   By: Marijo Conception M.D.   On: 02/19/2019 13:46   Dg Chest Portable 1 View  Result Date: 01/26/2019 CLINICAL DATA:  Unwitnessed fall.  Altered mental status. EXAM: PORTABLE CHEST 1 VIEW COMPARISON:  None. FINDINGS: The heart size and mediastinal contours are within normal limits. Both lungs are clear. The visualized skeletal structures are unremarkable. IMPRESSION: No active disease. Electronically Signed   By: Davina Poke M.D.   On: 02/15/2019 15:09     Personally Reviewed Labs: CBC: Recent Labs  Lab 02/07/2019 1240  WBC 8.4  NEUTROABS 6.1  HGB 12.7*  HCT 41.3  MCV 100.2*  PLT 638*   Basic Metabolic Panel: Recent Labs  Lab 02/13/2019 1240  NA 152*  K 4.8  CL 119*  CO2 17*  GLUCOSE 235*  BUN 60*  CREATININE 2.99*  CALCIUM 9.2  MG 2.3   GFR: CrCl cannot be calculated (Unknown ideal weight.). Liver Function Tests: Recent Labs  Lab 01/25/2019 1240  AST 37  ALT 15  ALKPHOS 65  BILITOT 0.4  PROT 6.6  ALBUMIN 3.9   No results for input(s): LIPASE, AMYLASE in the last 168 hours. No results for input(s): AMMONIA in the last 168 hours. Coagulation Profile: No results for input(s): INR, PROTIME in the last 168 hours. Cardiac Enzymes: No results for input(s): CKTOTAL, CKMB, CKMBINDEX, TROPONINI in the last 168 hours. BNP (last 3 results) No results for input(s): PROBNP in the last 8760 hours. HbA1C: No results for input(s): HGBA1C in the last 72 hours. CBG: No results for input(s): GLUCAP in the last 168 hours. Lipid Profile: No results for input(s): CHOL, HDL, LDLCALC, TRIG, CHOLHDL, LDLDIRECT in the last 72 hours. Thyroid Function Tests: No results for input(s): TSH, T4TOTAL, FREET4, T3FREE, THYROIDAB in the last 72 hours. Anemia Panel: No results for input(s): VITAMINB12, FOLATE, FERRITIN, TIBC, IRON, RETICCTPCT in the last 72 hours. Urine analysis:    Component Value Date/Time   COLORURINE YELLOW  02/15/2019 1228   APPEARANCEUR HAZY (A) 02/01/2019 1228   LABSPEC 1.016 02/21/2019 1228   PHURINE 5.0 01/25/2019 1228   GLUCOSEU >=500 (A) 01/31/2019 1228   HGBUR SMALL (A) 02/18/2019 1228   BILIRUBINUR NEGATIVE 02/03/2019 1228   KETONESUR NEGATIVE 02/07/2019 1228   PROTEINUR 30 (A) 02/17/2019 1228   NITRITE NEGATIVE 02/01/2019 1228   LEUKOCYTESUR NEGATIVE 01/27/2019 1228    Sepsis Labs:  No leukocytosis  Personally Reviewed EKG:  Sinus rhythm with PVCs, TWI laterally and QTC to 580s  Assessment/Plan Unwitnessed fall at facility Acute metabolic encephalopathy Suspect dehydration and mild DKA based on his labs.  Neuro exam reassuring also limited due to patient's inability to cooperate.  He is not on sedating medications.  CT head and cervical spine without acute finding.  No findings to suggest infectious  process at this time. -Hydrate with 0.45 NS at 150 cc/hours for 24 hours -Repeat BMP in the morning -RN to swallow test -PT/OT  Paroxysmal A. fib: In sinus rhythm.  Rate controlled with metoprolol.  On Eliquis for anticoagulation.  CT head without acute finding. -Continue home meds  Hypernatremia: Na 152.  Likely due to dehydration. -IV fluid as above  AKI with azotemia: Creatinine 2.99 (unknown baseline).  BUN 60.  Likely prerenal etiology.  He is also on low-dose lisinopril and Aldactone -Hydrate as above -Hold nephrotoxic meds. -Recheck in the morning  Mild DKA/IDDM-2: Bicarb 17 with anion gap of 16.  Glucose 236.  On Levemir 27 units at bedtime. -IV hydration as above -Basal and sliding scale insulin  Hypertension -Continue home metoprolol  Elevated troponin/abnormal EKG: Likely due to dehydration and delayed clearance from AKI versus ACS.  He has some TWI or lateral leads but no baseline EKG to compare to. -Trend high-sensitivity troponin -Recheck EKG in the morning -Continue metoprolol and Eliquis as above -Continue home statin  QTC: QTC prolonged to  580. -Hold home Zoloft -Closely monitor electrolytes and replenish -Magnesium.  DVT prophylaxis: Subcu heparin  Code Status: Full code per patient's daughter. Family Communication: Updated patient's daughter over the phone.  Disposition Plan: Admit to telemetry bed Consults called: None Admission status: Observation   Almon Herculesaye T Desteni Piscopo MD [facility]  If 7PM-7AM, please contact night-coverage [email or web address] Password [email or web address]  02/20/2019, 4:36 PM

## 2019-02-08 NOTE — ED Triage Notes (Signed)
Patient is from [facility] where he had an unwitnessed fall and 0800 today. He was last seen normal at 0730. He has been brought in for altered mental status. He can carry on a conversation and can walk with a walker. EMS reported strong urine and he has a history of UTIs. EMS also reports the patient was too altered to do a stroke screen. He currently is not on blood thinners. 500 cc of fluid was administer by EMS.    EMS vitals and CBG:  118/68 BP 60 HR 378 CBG 14 Resp rate 95% O2 sat on room air

## 2019-02-08 NOTE — ED Provider Notes (Signed)
[facility]-EMERGENCY DEPT Provider Note   CSN: [id number] Arrival date & time: 02/14/2019  1054    History   Chief Complaint Chief Complaint  Patient presents with   Altered Mental Status    HPI Stephen Muslimlex Leak Jr. is a 85 y.o. male.     Level 5 caveat due to dementia.   The history is provided by the patient and a caregiver.  Altered Mental Status Presenting symptoms: lethargy   Severity:  Mild Most recent episode:  Today Episode history:  Single Timing:  Constant Progression:  Unchanged Chronicity:  New Context: dementia (recent fall this morning, on blood thinners)   Associated symptoms: no abdominal pain     No past medical history on file.  Patient Active Problem List   Diagnosis Date Noted   Chronic pain of right knee 09/07/2018    No past surgical history on file.      Home Medications    Prior to Admission medications   Medication Sig Start Date End Date Taking? Authorizing Provider  ELIQUIS 5 MG TABS tablet Take 5 mg by mouth 2 (two) times daily. 07/30/18  Yes [provider]  insulin aspart (NOVOLOG) 100 UNIT/ML injection Inject 5 Units into the skin 2 (two) times a day. with breakfast and dinner   Yes [provider]  insulin detemir (LEVEMIR) 100 UNIT/ML injection Inject 27 Units into the skin at bedtime.   Yes [provider]  ipratropium-albuterol (DUONEB) 0.5-2.5 (3) MG/3ML SOLN Take 3 mLs by nebulization every 8 (eight) hours as needed (wheezing or shortness of breath).   Yes [provider]  lisinopril (ZESTRIL) 10 MG tablet Take 10 mg by mouth daily.   Yes [provider]  loperamide (IMODIUM) 2 MG capsule Take 2 mg by mouth every 6 (six) hours as needed for diarrhea or loose stools.   Yes [provider]  magnesium oxide (MAG-OX) 400 MG tablet Take 400 mg by mouth every other day.   Yes [provider]  Melatonin 3 MG TABS Take 6 mg by mouth at bedtime.  05/12/18   Yes [provider]  metoprolol succinate (TOPROL-XL) 100 MG 24 hr tablet Take 50 mg by mouth daily. Take with or immediately following a meal.   Yes [provider]  rosuvastatin (CRESTOR) 10 MG tablet Take 10 mg by mouth at bedtime.   Yes [provider]  sertraline (ZOLOFT) 25 MG tablet Take 25 mg by mouth at bedtime.   Yes [provider]  spironolactone (ALDACTONE) 25 MG tablet Take 12.5 mg by mouth daily.   Yes [provider]  vitamin B-12 (CYANOCOBALAMIN) 1000 MCG tablet Take 1,000 mcg by mouth daily.   Yes [provider]    Family History No family history on file.  Social History Social History   Tobacco Use   Smoking status: Never Smoker   Smokeless tobacco: Never Used  Substance Use Topics   Alcohol use: Not on file   Drug use: Not on file     Allergies   Patient has no known allergies.   Review of Systems Review of Systems  Unable to perform ROS: Dementia  Gastrointestinal: Negative for abdominal pain.     Physical Exam Updated Vital Signs ED Triage Vitals [02/16/2019 1119]  Enc Vitals Group     BP (!) 110/55     Pulse Rate 63     Resp 13     Temp 97.9 F (36.6 C)  Temp Source Oral     SpO2 100 %     Weight      Height      Head Circumference      Peak Flow      Pain Score      Pain Loc      Pain Edu?      Excl. in [place]?     Physical Exam Vitals signs and nursing note reviewed.  Constitutional:      General: He is not in acute distress.    Appearance: He is well-developed. He is not ill-appearing.  HENT:     Head: Normocephalic and atraumatic.  Eyes:     Extraocular Movements: Extraocular movements intact.     Conjunctiva/sclera: Conjunctivae normal.     Pupils: Pupils are equal, round, and reactive to light.  Neck:     Musculoskeletal: Normal range of motion and neck supple.  Cardiovascular:     Rate and Rhythm: Normal rate and regular rhythm.     Pulses: Normal pulses.      Heart sounds: Normal heart sounds. No murmur.  Pulmonary:     Effort: Pulmonary effort is normal. No respiratory distress.     Breath sounds: Normal breath sounds.  Abdominal:     General: There is no distension.     Palpations: Abdomen is soft.     Tenderness: There is no abdominal tenderness.  Skin:    General: Skin is warm and dry.  Neurological:     General: No focal deficit present.     Mental Status: He is alert.     Comments: Slow to respond, follows commands, moves all extremities, pupils normal  Psychiatric:        Mood and Affect: Mood normal.      ED Treatments / Results  Labs (all labs ordered are listed, but only abnormal results are displayed) Labs Reviewed  CBC WITH DIFFERENTIAL/PLATELET - Abnormal; Notable for the following components:      Result Value   RBC 4.12 (*)    Hemoglobin 12.7 (*)    MCV 100.2 (*)    Platelets 124 (*)    All other components within normal limits  BASIC METABOLIC PANEL - Abnormal; Notable for the following components:   Sodium 152 (*)    Chloride 119 (*)    CO2 17 (*)    Glucose, Bld 235 (*)    BUN 60 (*)    Creatinine, Ser 2.99 (*)    GFR calc non Af Amer 18 (*)    GFR calc Af Amer 21 (*)    Anion gap 16 (*)    All other components within normal limits  HEPATIC FUNCTION PANEL - Abnormal; Notable for the following components:   Indirect Bilirubin 0.2 (*)    All other components within normal limits  URINALYSIS, ROUTINE W REFLEX MICROSCOPIC - Abnormal; Notable for the following components:   APPearance HAZY (*)    Glucose, UA >=500 (*)    Hgb urine dipstick SMALL (*)    Protein, ur 30 (*)    Bacteria, UA RARE (*)    All other components within normal limits  TROPONIN I (HIGH SENSITIVITY) - Abnormal; Notable for the following components:   Troponin I (High Sensitivity) 36 (*)    All other components within normal limits  SARS CORONAVIRUS 2 (HOSPITAL ORDER, [place])  URINE CULTURE  MAGNESIUM    CK    EKG EKG Interpretation  Date/Time:  Wednesday  February 08 2019 11:18:37 EDT Ventricular Rate:  65 PR Interval:    QRS Duration: 112 QT Interval:  566 QTC Calculation: 589 R Axis:   -52 Text Interpretation:  Sinus rhythm Multiple ventricular premature complexes Abnormal R-wave progression, late transition LVH w/ repol abnormalities, possible ischemia Prolonged QT interval no prior Confirmed by Virgina NorfolkAdam, Bettyann Birchler [phone] on 2018-10-24 11:41:07 AM   Radiology Ct Head Wo Contrast  Result Date: 2018-10-24 CLINICAL DATA:  Unwitnessed fall.  Altered mental status. EXAM: CT HEAD WITHOUT CONTRAST CT CERVICAL SPINE WITHOUT CONTRAST TECHNIQUE: Multidetector CT imaging of the head and cervical spine was performed following the standard protocol without intravenous contrast. Multiplanar CT image reconstructions of the cervical spine were also generated. COMPARISON:  None. FINDINGS: CT HEAD FINDINGS Brain: Mild diffuse cortical atrophy is noted. Mild chronic ischemic white matter disease is noted. 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 No mass effect or midline shift is noted. Ventricular size is within normal limits. There is no evidence of mass lesion, hemorrhage or acute infarction. Vascular: No hyperdense vessel or unexpected calcification. Skull: Normal. Negative for fracture or focal lesion. Sinuses/Orbits: No acute finding. Other: None. CT CERVICAL SPINE FINDINGS Alignment: Normal. Skull base and vertebrae: No acute fracture. No primary bone lesion or focal pathologic process. Soft tissues and spinal canal: No prevertebral fluid or swelling. No visible canal hematoma. Disc levels: Severe degenerative disc disease is noted at C3-4, C4-5, C5-6, C6-7 and C7-T1 with anterior osteophyte formation. Upper chest: Negative. Other: None. IMPRESSION: Mild diffuse cortical atrophy. Mild chronic ischemic white matter disease. No acute intracranial abnormality seen. Severe multilevel degenerative disc disease.  No acute abnormality seen in the cervical spine. Electronically Signed   By: Lupita RaiderJames  Green Jr M.D.   On: 02020-03-30 13:46   Ct Cervical Spine Wo Contrast  Result Date: 2018-10-24 CLINICAL DATA:  Unwitnessed fall.  Altered mental status. EXAM: CT HEAD WITHOUT CONTRAST CT CERVICAL SPINE WITHOUT CONTRAST TECHNIQUE: Multidetector CT imaging of the head and cervical spine was performed following the standard protocol without intravenous contrast. Multiplanar CT image reconstructions of the cervical spine were also generated. COMPARISON:  None. FINDINGS: CT HEAD FINDINGS Brain: Mild diffuse cortical atrophy is noted. Mild chronic ischemic white matter disease is noted. 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 No mass effect or midline shift is noted. Ventricular size is within normal limits. There is no evidence of mass lesion, hemorrhage or acute infarction. Vascular: No hyperdense vessel or unexpected calcification. Skull: Normal. Negative for fracture or focal lesion. Sinuses/Orbits: No acute finding. Other: None. CT CERVICAL SPINE FINDINGS Alignment: Normal. Skull base and vertebrae: No acute fracture. No primary bone lesion or focal pathologic process. Soft tissues and spinal canal: No prevertebral fluid or swelling. No visible canal hematoma. Disc levels: Severe degenerative disc disease is noted at C3-4, C4-5, C5-6, C6-7 and C7-T1 with anterior osteophyte formation. Upper chest: Negative. Other: None. IMPRESSION: Mild diffuse cortical atrophy. Mild chronic ischemic white matter disease. No acute intracranial abnormality seen. Severe multilevel degenerative disc disease. No acute abnormality seen in the cervical spine. Electronically Signed   By: Lupita RaiderJames  Green Jr M.D.   On: 02020-03-30 13:46    Procedures .Critical Care Performed by: Virgina Norfolkuratolo, Wilford Merryfield, DO Authorized by: Virgina Norfolkuratolo, Rosie Golson, DO   Critical care provider statement:    Critical care time (minutes):  40   Critical care was necessary to treat or  prevent imminent or life-threatening deterioration of the following conditions:  Metabolic crisis   Critical care was time spent personally by  me on the following activities:  Blood draw for specimens, development of treatment plan with patient or surrogate, discussions with consultants, evaluation of patient's response to treatment, examination of patient, ordering and review of laboratory studies, ordering and performing treatments and interventions, ordering and review of radiographic studies, pulse oximetry, re-evaluation of patient's condition, obtaining history from patient or surrogate and review of old charts   I assumed direction of critical care for this patient from another provider in my specialty: no     (including critical care time)  Medications Ordered in ED Medications  sodium chloride 0.9 % bolus 1,000 mL (0 mLs Intravenous Stopped 01/28/2019 1404)     Initial Impression / Assessment and Plan / ED Course  I have reviewed the triage vital signs and the nursing notes.  Pertinent labs & imaging results that were available during my care of the patient were reviewed by me and considered in my medical decision making (see chart for details).     Stephen MuslimAlex Leak Jr. is an 85 year old male with history of dementia, diabetes, hypertension, atrial fibrillation on blood thinners who presents to the ED after unwitnessed fall with altered mental status.  Patient with normal vitals.  No fever.  According to nursing staff patient had a fall that was unwitnessed.  Has not been acting like himself since.  Patient is usually more conversational.  He appears to follow commands, can tell me his name.  Although he is slow to respond.  Pupils are normal.  Patient moves all extremities.  No signs of external trauma.  EKG showed sinus rhythm with prolonged QTC and T wave inversions laterally.  No prior EKGs to compare to.  Troponin mildly elevated to 36.  Patient otherwise with creatinine of 2.99, bicarb is 17,  blood sugar of 235, sodium of 152.  Also no prior to compare to.  Likely with dehydration causing acute kidney injury, hypernatremia, possibly mild DKA.  No ketones in the urine.  No urinary tract infection.  No significant leukocytosis or anemia.  Coronavirus test is negative.  Head CT and neck CT unremarkable.  Overall patient with metabolic abnormalities with EKG consistent with possible ischemic changes and mild elevation in troponin.  Patient to be admitted for IV hydration, trending of troponin. Low concern for ACS at this time.  Will hold on heparin.  Patient already on Eliquis.  Hospitalist agrees with plan and admission.  Hemodynamically stable throughout my care.  This chart was dictated using voice recognition software.  Despite best efforts to proofread,  errors can occur which can change the documentation meaning.    Final Clinical Impressions(s) / ED Diagnoses   Final diagnoses:  Altered mental status, unspecified altered mental status type  Hypernatremia  Elevated troponin  AKI (acute kidney injury) [facility](HCC)  Hyperglycemia    ED Discharge Orders    None       Virgina NorfolkCuratolo, Matheus Spiker, DO 02/15/2019 1449

## 2019-02-09 DIAGNOSIS — R296 Repeated falls: Secondary | ICD-10-CM | POA: Diagnosis not present

## 2019-02-09 DIAGNOSIS — R400 Somnolence: Secondary | ICD-10-CM | POA: Diagnosis not present

## 2019-02-09 DIAGNOSIS — I119 Hypertensive heart disease without heart failure: Secondary | ICD-10-CM | POA: Diagnosis present

## 2019-02-09 DIAGNOSIS — D696 Thrombocytopenia, unspecified: Secondary | ICD-10-CM | POA: Diagnosis present

## 2019-02-09 DIAGNOSIS — R4182 Altered mental status, unspecified: Secondary | ICD-10-CM | POA: Diagnosis not present

## 2019-02-09 DIAGNOSIS — E875 Hyperkalemia: Secondary | ICD-10-CM | POA: Diagnosis not present

## 2019-02-09 DIAGNOSIS — Z66 Do not resuscitate: Secondary | ICD-10-CM | POA: Diagnosis not present

## 2019-02-09 DIAGNOSIS — Z7901 Long term (current) use of anticoagulants: Secondary | ICD-10-CM | POA: Diagnosis not present

## 2019-02-09 DIAGNOSIS — E860 Dehydration: Secondary | ICD-10-CM | POA: Diagnosis present

## 2019-02-09 DIAGNOSIS — Z20828 Contact with and (suspected) exposure to other viral communicable diseases: Secondary | ICD-10-CM | POA: Diagnosis present

## 2019-02-09 DIAGNOSIS — I7389 Other specified peripheral vascular diseases: Secondary | ICD-10-CM | POA: Diagnosis present

## 2019-02-09 DIAGNOSIS — R569 Unspecified convulsions: Secondary | ICD-10-CM | POA: Diagnosis not present

## 2019-02-09 DIAGNOSIS — Z7189 Other specified counseling: Secondary | ICD-10-CM | POA: Diagnosis not present

## 2019-02-09 DIAGNOSIS — J189 Pneumonia, unspecified organism: Secondary | ICD-10-CM | POA: Diagnosis present

## 2019-02-09 DIAGNOSIS — I959 Hypotension, unspecified: Secondary | ICD-10-CM | POA: Diagnosis not present

## 2019-02-09 DIAGNOSIS — E876 Hypokalemia: Secondary | ICD-10-CM | POA: Diagnosis not present

## 2019-02-09 DIAGNOSIS — N179 Acute kidney failure, unspecified: Secondary | ICD-10-CM | POA: Diagnosis present

## 2019-02-09 DIAGNOSIS — E878 Other disorders of electrolyte and fluid balance, not elsewhere classified: Secondary | ICD-10-CM | POA: Diagnosis present

## 2019-02-09 DIAGNOSIS — R5081 Fever presenting with conditions classified elsewhere: Secondary | ICD-10-CM | POA: Diagnosis not present

## 2019-02-09 DIAGNOSIS — R7989 Other specified abnormal findings of blood chemistry: Secondary | ICD-10-CM | POA: Diagnosis present

## 2019-02-09 DIAGNOSIS — R21 Rash and other nonspecific skin eruption: Secondary | ICD-10-CM | POA: Diagnosis not present

## 2019-02-09 DIAGNOSIS — E870 Hyperosmolality and hypernatremia: Secondary | ICD-10-CM | POA: Diagnosis present

## 2019-02-09 DIAGNOSIS — D638 Anemia in other chronic diseases classified elsewhere: Secondary | ICD-10-CM | POA: Diagnosis present

## 2019-02-09 DIAGNOSIS — E1110 Type 2 diabetes mellitus with ketoacidosis without coma: Secondary | ICD-10-CM | POA: Diagnosis present

## 2019-02-09 DIAGNOSIS — D649 Anemia, unspecified: Secondary | ICD-10-CM | POA: Diagnosis not present

## 2019-02-09 DIAGNOSIS — W19XXXA Unspecified fall, initial encounter: Secondary | ICD-10-CM | POA: Diagnosis present

## 2019-02-09 DIAGNOSIS — I480 Paroxysmal atrial fibrillation: Secondary | ICD-10-CM | POA: Diagnosis present

## 2019-02-09 DIAGNOSIS — I10 Essential (primary) hypertension: Secondary | ICD-10-CM | POA: Diagnosis not present

## 2019-02-09 DIAGNOSIS — F0280 Dementia in other diseases classified elsewhere without behavioral disturbance: Secondary | ICD-10-CM | POA: Diagnosis present

## 2019-02-09 DIAGNOSIS — G9340 Encephalopathy, unspecified: Secondary | ICD-10-CM | POA: Diagnosis not present

## 2019-02-09 DIAGNOSIS — Z515 Encounter for palliative care: Secondary | ICD-10-CM | POA: Diagnosis not present

## 2019-02-09 DIAGNOSIS — J9601 Acute respiratory failure with hypoxia: Secondary | ICD-10-CM | POA: Diagnosis not present

## 2019-02-09 DIAGNOSIS — R5381 Other malaise: Secondary | ICD-10-CM | POA: Diagnosis not present

## 2019-02-09 DIAGNOSIS — E785 Hyperlipidemia, unspecified: Secondary | ICD-10-CM | POA: Diagnosis present

## 2019-02-09 DIAGNOSIS — G3183 Dementia with Lewy bodies: Secondary | ICD-10-CM | POA: Diagnosis present

## 2019-02-09 DIAGNOSIS — G9341 Metabolic encephalopathy: Secondary | ICD-10-CM | POA: Diagnosis present

## 2019-02-09 DIAGNOSIS — R401 Stupor: Secondary | ICD-10-CM | POA: Diagnosis not present

## 2019-02-09 LAB — CBC
HCT: 36 % — ABNORMAL LOW (ref 39.0–52.0)
Hemoglobin: 11 g/dL — ABNORMAL LOW (ref 13.0–17.0)
MCH: 30.3 pg (ref 26.0–34.0)
MCHC: 30.6 g/dL (ref 30.0–36.0)
MCV: 99.2 fL (ref 80.0–100.0)
Platelets: 115 10*3/uL — ABNORMAL LOW (ref 150–400)
RBC: 3.63 MIL/uL — ABNORMAL LOW (ref 4.22–5.81)
RDW: 13 % (ref 11.5–15.5)
WBC: 7.3 10*3/uL (ref 4.0–10.5)
nRBC: 0 % (ref 0.0–0.2)

## 2019-02-09 LAB — FOLATE: Folate: 13.5 ng/mL (ref >5.9)

## 2019-02-09 LAB — FERRITIN: Ferritin: 144 ng/mL (ref 24–336)

## 2019-02-09 LAB — BASIC METABOLIC PANEL
Anion gap: 10 (ref 5–15)
BUN: 57 mg/dL — ABNORMAL HIGH (ref 8–23)
CO2: 20 mmol/L — ABNORMAL LOW (ref 22–32)
Calcium: 8.7 mg/dL — ABNORMAL LOW (ref 8.9–10.3)
Chloride: 119 mmol/L — ABNORMAL HIGH (ref 98–111)
Creatinine, Ser: 2.21 mg/dL — ABNORMAL HIGH (ref 0.61–1.24)
GFR calc Af Amer: 30 mL/min — ABNORMAL LOW (ref >60)
GFR calc non Af Amer: 26 mL/min — ABNORMAL LOW (ref >60)
Glucose, Bld: 170 mg/dL — ABNORMAL HIGH (ref 70–99)
Potassium: 3.6 mmol/L (ref 3.5–5.1)
Sodium: 149 mmol/L — ABNORMAL HIGH (ref 135–145)

## 2019-02-09 LAB — IRON AND TIBC
Iron: 61 ug/dL (ref 45–182)
Saturation Ratios: 30 % (ref 17.9–39.5)
TIBC: 206 ug/dL — ABNORMAL LOW (ref 250–450)
UIBC: 145 ug/dL

## 2019-02-09 LAB — HEMOGLOBIN A1C
Hgb A1c MFr Bld: 9.6 % — ABNORMAL HIGH (ref 4.8–5.6)
Mean Plasma Glucose: 228.82 mg/dL

## 2019-02-09 LAB — VITAMIN B12: Vitamin B-12: 1803 pg/mL — ABNORMAL HIGH (ref 180–914)

## 2019-02-09 LAB — URINE CULTURE: Culture: NO GROWTH

## 2019-02-09 LAB — GLUCOSE, CAPILLARY
Glucose-Capillary: 155 mg/dL — ABNORMAL HIGH (ref 70–99)
Glucose-Capillary: 237 mg/dL — ABNORMAL HIGH (ref 70–99)
Glucose-Capillary: 183 mg/dL — ABNORMAL HIGH (ref 70–99)
Glucose-Capillary: 222 mg/dL — ABNORMAL HIGH (ref 70–99)

## 2019-02-09 LAB — RETICULOCYTES
Immature Retic Fract: 12.1 % (ref 2.3–15.9)
RBC.: 3.54 MIL/uL — ABNORMAL LOW (ref 4.22–5.81)
Retic Count, Absolute: 38.9 10*3/uL (ref 19.0–186.0)
Retic Ct Pct: 1.1 % (ref 0.4–3.1)

## 2019-02-09 LAB — TROPONIN I (HIGH SENSITIVITY)
Troponin I (High Sensitivity): 42 ng/L — ABNORMAL HIGH (ref <18)
Troponin I (High Sensitivity): 45 ng/L — ABNORMAL HIGH (ref <18)

## 2019-02-09 MED ORDER — METOPROLOL SUCCINATE ER 50 MG PO TB24
50.0000 mg | ORAL_TABLET | Freq: Every day | ORAL | Status: DC
  Administered 2019-02-09 – 2019-02-16 (×6): 50 mg via ORAL
  Filled 2019-02-09 (×7): qty 1

## 2019-02-09 MED ORDER — IPRATROPIUM-ALBUTEROL 0.5-2.5 (3) MG/3ML IN SOLN
3.0000 mL | Freq: Three times a day (TID) | RESPIRATORY_TRACT | Status: DC | PRN
  Administered 2019-02-10 – 2019-02-11 (×3): 3 mL via RESPIRATORY_TRACT
  Filled 2019-02-09 (×3): qty 3

## 2019-02-09 MED ORDER — APIXABAN 5 MG PO TABS
5.0000 mg | ORAL_TABLET | Freq: Two times a day (BID) | ORAL | Status: DC
  Administered 2019-02-09: 10:00:00 5 mg via ORAL
  Filled 2019-02-09: qty 1

## 2019-02-09 MED ORDER — APIXABAN 2.5 MG PO TABS
2.5000 mg | ORAL_TABLET | Freq: Two times a day (BID) | ORAL | Status: DC
  Administered 2019-02-09 – 2019-02-15 (×12): 2.5 mg via ORAL
  Filled 2019-02-09 (×13): qty 1

## 2019-02-09 MED ORDER — ROSUVASTATIN CALCIUM 5 MG PO TABS
10.0000 mg | ORAL_TABLET | Freq: Every day | ORAL | Status: DC
  Administered 2019-02-09 – 2019-02-16 (×8): 10 mg via ORAL
  Filled 2019-02-09 (×6): qty 1
  Filled 2019-02-09: qty 2
  Filled 2019-02-09 (×2): qty 1

## 2019-02-09 MED ORDER — VITAMIN B-12 1000 MCG PO TABS
1000.0000 ug | ORAL_TABLET | Freq: Every day | ORAL | Status: DC
  Administered 2019-02-09 – 2019-02-15 (×7): 1000 ug via ORAL
  Filled 2019-02-09 (×8): qty 1

## 2019-02-09 MED ORDER — MAGNESIUM OXIDE 400 (241.3 MG) MG PO TABS
400.0000 mg | ORAL_TABLET | ORAL | Status: DC
  Administered 2019-02-09 – 2019-02-15 (×4): 400 mg via ORAL
  Filled 2019-02-09 (×7): qty 1

## 2019-02-09 MED ORDER — DEXTROSE 5 % IV SOLN
INTRAVENOUS | Status: AC
  Administered 2019-02-09 – 2019-02-10 (×3): via INTRAVENOUS

## 2019-02-09 NOTE — Progress Notes (Signed)
PROGRESS NOTE    Stephen MuslimAlex Leak Jr.  WJX:[id number] DOB: 06/25/1929 DOA: 02/18/2019 [facility]   Brief Narrative:  HPI per Dr. Candelaria Stagersaye Gonfa on 02/07/2019 Stephen MuslimAlex Leak Jr. is a 85 y.o. male with history of diabetes, dementia, hypertension and A. fib on Eliquis presenting with unwitnessed fall and altered mental status.   Patient was not able to provide history due to encephalopathy/dementia.  History based on chart review and discussion with EDP.  Minimally follows command.  Does not appear to be in distress.  Trying to get out of the bed.  Responds no to pain.  Called and talked to patient's daughter who had a call from facility saying he had unwitnessed fall.  She was told his eyes were rolling backward.  She did not have more detail.  She confirms with me that he is full code.  Per EMS, not able to do stroke screen due to encephalopathy.  Was given 500 cc normal saline bolus.  Vital signs not impressive.  CBG elevated to 378.  In ED, hemodynamically stable.  100% on room air.  Hgb 12.7.  MCV 100.  Platelet 124.  Sodium 152.  Bicarb 17.  Glucose 235.  BUN 60.  Creatinine 2.99 (no baseline to compare to).  Anion gap 16.  Otherwise CMP not impressive.  High-sensitivity troponin 36.  EKG with sinus rhythm, PVCs, TW bilaterally and QTC to 580s.  Urinalysis with glucose greater than 500.  No ketones.  Portable CXR, CT head and cervical spine without acute finding.  COVID-19 negative.  Was given normal saline bolus 1 L and hospitalist service was called for admission for encephalopathy and fall.  **Interim History  Patient was somnolent on my examination today and remained altered.  Closes eyes for most of examination but open from a briefly and then will respond to painful stimuli.  SLP done and recommending dysphagia 2 diet with thin liquids.  Creatinine is trending down with IV fluid resuscitation and fluid has been changed to D5W  Assessment & Plan:   Active Problems:   Acute  encephalopathy  Unwitnessed Fall at facility Acute metabolic encephalopathy in the setting of Prior Dementia Dehydration  -Suspect dehydration and mild DKA based on his labs.   -Initial Neuro exam reassuring also limited due to patient's inability to cooperate but today he was somnolent but arousable to painful stimulu.  He is not on sedating medications.  CT head and cervical spine without acute finding.  No findings to suggest infectious process at this time. -Hydrate with 0.45 NS at 150 cc/hours for 24 hours and this was changed to D5W at 75 mL/hr -RN to swallow test and SLP evaluated and recommending Dysphagia 2 Diet with Thin Liquids and Puree Medications  -PT/OT to Evaluate and Treat and they are recommending SNF -Patient not as awake this AM and was sleeping but arousable to painful stimuli   Paroxysmal A. Fib -In sinus rhythm currently. -Rate controlled with Metoprolol Succinate 50 mg po Daily  On Eliquis for anticoagulation and dose was adjusted for Renal Fxn.  CT head without acute finding. -Continue home meds  Hypernatremia and Hyperchloremia  -Na 152 on Admission and Likely due to dehydration. -IV fluid as above changed to D5W at 75 mL/hr  -Na+ is slowly improving and is now 149 and Chloride is still 119 -Continue to Monitor and Trend -Repeat CMP in AM   AKI with Azotemia, improving  -Creatinine 2.99 (unknown baseline) and BUN 60 on admission .   -  Likely prerenal etiology.   -He is also on low-dose lisinopril and Aldactone at home and this has been held [facility] as above; BUN/creatinine is trending down and went from 60/2.9 and is now 57/2.21 -Hold Nephrotoxic Meds, avoid contrast dyes, and hypotension  -Continue to Monitor and Trend Renal Fxn; Repeat CMP in AM   Mild DKA/ Uncontrolled IDDM-2, improving  -Bicarb 17 with anion gap of 16.  Glucose 236.   -Repeat BMP showed CO2 was 20 and AG was 10 -On Levemir 27 units at bedtime but will change to 10 units BID -IV  hydration as above -HbA1c was 9.6 -CBG's ranging from 155-237 -Started Sensitive Novolog SSI AC/HS and will continue to Monitor Blood Sugars Carefully  -Will need Diabetic Education Coordinator evaluation  Hypertension -Continue Home Metoprolol 50 mg po Daily   Elevated Troponin/abnormal EKG -Likely due to dehydration and delayed clearance from AKI versus ACS.  -He has some TWI or lateral leads but no baseline EKG to compare to. -Trend high-sensitivity troponin and slightly trended up and went from 36 -> 50.0 -> 53.0 -Recheck EKG in the morning -Continue Metoprolol and Eliquis as above -Continue home statin -If significantly worsening will need Cardiology Evaluation   QTC Prolongation -QTC prolonged to 580 and repeat EKG showed  -Hold home Zoloft -Closely monitor electrolytes and replenish  -Repeat EKG now  -Magnesium.  Normocytic Anemia/Suspected Anemia of CKD -Patient's Hgb/hHct went from 12.7/41.3 -> 11.5/36.8 -> 11.0/99.2 -In the setting of IVF and ? Dilutional Drop -Anemia panel was checked and showed an iron level of 61, U IBC of 145, TIBC of 206, saturation ratios of 30%, ferritin level 144, folate level 30.5, vitamin B12 of 1803 -Continue to monitor for signs and symptoms of bleeding; currently no overt bleeding noted -Repeat CBC in a.m.  Thrombocytopenia -Patient's platelet count dropped from 124,000 is 115,000 -Continue to monitor for signs and symptoms of bleeding Exline-suspected in the setting of dehydration but will need to look into other causes -Repeat CBC in a.m.  DVT prophylaxis: Anticoagulated with Apixaban 2.5 mg po BID  Code Status: FULL CODE Family Communication: No family present at bedside but updated daughter over the phone. Disposition Plan: Remain Inpatient for continued workup and treatment   Consultants:   None   Procedures: None  Antimicrobials:  Anti-infectives (From admission, onward)   None     Subjective: Seen and examined at  bedside remains encephalopathic and confused and was more somnolent but arousable with painful stimuli.  He did arouse on sternal rub but would fall back to sleep. Nurse reports he remained altered last night. No other concerns or complaints at this time.   Objective: Vitals:   01/25/2019 1816 01/30/2019 2023 02/09/19 0516 02/09/19 1000  BP: 123/76 (!) 153/78 (!) 163/92   Pulse: 62 64 (!) 59   Resp: Temp: 98.3 F (36.8 C) 98.9 F (37.2 C) 97.7 F (36.5 C)   TempSrc: Oral Oral Oral   SpO2: 97%  100%   Weight:    78.2 kg  Height:     (1.727 m)    Intake/Output Summary (Last 24 hours) at 02/09/2019 1520 Last data filed at 02/09/2019 1500 Gross per 24 hour  Intake 772.98 ml  Output --  Net 772.98 ml   Filed Weights   02/09/19 1000  Weight: 78.2 kg   Examination: Physical Exam:  Constitutional: WN/WD overweight elderly AAM in NAD and appears calm and somnolent  Eyes: Lids and conjunctivae normal, sclerae  anicteric  ENMT: External Ears, Nose appear normal. Mucous membranes are extremely dry Neck: Appears normal, supple, no cervical masses, normal ROM, no appreciable thyromegaly; no JVD Respiratory: Diminished to auscultation bilaterally, no wheezing, rales, rhonchi or crackles. Normal respiratory effort and patient is not tachypenic. No accessory muscle use.  Cardiovascular: Slightly bradycardic rate, no murmurs / rubs / gallops. S1 and S2 auscultated. Minimal extremity edema. Abdomen: Soft, non-tender, Distended. No masses palpated. No appreciable hepatosplenomegaly. Bowel sounds positive x4.  GU: Deferred. Musculoskeletal: No clubbing / cyanosis of digits/nails. No joint deformity upper and lower extremities. Skin: No rashes, lesions, ulcers on a limited skin evaluation. No induration; Warm and dry.  Neurologic: CN 2-12 grossly intact with no focal deficits. Romberg sign and cerebellar reflexes not assessed.  Psychiatric: Impaired judgment and insight. Not Alert and  oriented x 3. Extremely somnolent  Data Reviewed: I have personally reviewed following labs and imaging studies  CBC: Recent Labs  Lab February 20, 2019 1240 02-20-19 1857 02/09/19 0500  WBC 8.4 7.7 7.3  NEUTROABS 6.1  --   --   HGB 12.7* 11.5* 11.0*  HCT 41.3 36.8* 36.0*  MCV 100.2* 100.3* 99.2  PLT 124* 115* 629*   Basic Metabolic Panel: Recent Labs  Lab 02-20-19 1240 Feb 20, 2019 1857 02/09/19 0500  NA 152* 151* 149*  K 4.8 4.0 3.6  CL 119* 118* 119*  CO2 17* 23 20*  GLUCOSE 235* 256* 170*  BUN 60* 63* 57*  CREATININE 2.99* 2.78*   2.75* 2.21*  CALCIUM 9.2 8.9 8.7*  MG 2.3 2.1  --    GFR: Estimated Creatinine Clearance: 22.8 mL/min (A) (by C-G formula based on SCr of 2.21 mg/dL (H)). Liver Function Tests: Recent Labs  Lab 20-Feb-2019 1240  AST 37  ALT 15  ALKPHOS 65  BILITOT 0.4  PROT 6.6  ALBUMIN 3.9   No results for input(s): LIPASE, AMYLASE in the last 168 hours. No results for input(s): AMMONIA in the last 168 hours. Coagulation Profile: No results for input(s): INR, PROTIME in the last 168 hours. Cardiac Enzymes: Recent Labs  Lab 02-20-19 1857  CKTOTAL 607*   BNP (last 3 results) No results for input(s): PROBNP in the last 8760 hours. HbA1C: Recent Labs    02/09/19 0500  HGBA1C 9.6*   CBG: Recent Labs  Lab Feb 20, 2019 2120 02/09/19 0821 02/09/19 1150  GLUCAP 203* 155* 237*   Lipid Profile: No results for input(s): CHOL, HDL, LDLCALC, TRIG, CHOLHDL, LDLDIRECT in the last 72 hours. Thyroid Function Tests: Recent Labs    02-20-19 1857  TSH 0.696   Anemia Panel: Recent Labs    02/09/19 0841  VITAMINB12 1,803*  FOLATE 13.5  FERRITIN 144  TIBC 206*  IRON 61  RETICCTPCT 1.1   Sepsis Labs: No results for input(s): PROCALCITON, LATICACIDVEN in the last 168 hours.  Recent Results (from the past 240 hour(s))  Urine culture     Status: None   Collection Time: 02/20/19 12:29 PM   Specimen: Urine, Random  Result Value Ref Range Status    Specimen Description   Final    URINE, RANDOM Performed at [facility] [address] [place], [place] [address]    Special Requests   Final    NONE Performed at [facility], [place] [address] [place], [place] [address]    Culture   Final    NO GROWTH Performed at [facility], [place] [address], [place], [place] [address]    Report Status 02/09/2019  FINAL  Final  SARS Coronavirus 2 (CEPHEID- Performed in [facility]), Hosp Order     Status: None   Collection Time: 02-26-2019  1:14 PM   Specimen: Nasopharyngeal Swab  Result Value Ref Range Status   SARS Coronavirus 2 NEGATIVE NEGATIVE Final    Comment: (NOTE) If result is NEGATIVE SARS-CoV-2 target nucleic acids are NOT DETECTED. The SARS-CoV-2 RNA is generally detectable in upper and lower  respiratory specimens during the acute phase of infection. The lowest  concentration of SARS-CoV-2 viral copies this assay can detect is 250  copies / mL. A negative result does not preclude SARS-CoV-2 infection  and should not be used as the sole basis for treatment or other  patient management decisions.  A negative result may occur with  improper specimen collection / handling, submission of specimen other  than nasopharyngeal swab, presence of viral mutation(s) within the  areas targeted by this assay, and inadequate number of viral copies  (<250 copies / mL). A negative result must be combined with clinical  observations, patient history, and epidemiological information. If result is POSITIVE SARS-CoV-2 target nucleic acids are DETECTED. The SARS-CoV-2 RNA is generally detectable in upper and lower  respiratory specimens dur ing the acute phase of infection.  Positive  results are indicative of active infection with SARS-CoV-2.  Clinical  correlation with patient history and other diagnostic information is  necessary to determine patient infection status.  Positive results do  not  rule out bacterial infection or co-infection with other viruses. If result is PRESUMPTIVE POSTIVE SARS-CoV-2 nucleic acids MAY BE PRESENT.   A presumptive positive result was obtained on the submitted specimen  and confirmed on repeat testing.  While 2019 novel coronavirus  (SARS-CoV-2) nucleic acids may be present in the submitted sample  additional confirmatory testing may be necessary for epidemiological  and / or clinical management purposes  to differentiate between  SARS-CoV-2 and other Sarbecovirus currently known to infect humans.  If clinically indicated additional testing with an alternate test  methodology [phone]([id number]) is advised. The SARS-CoV-2 RNA is generally  detectable in upper and lower respiratory sp ecimens during the acute  phase of infection. The expected result is Negative. Fact Sheet for Patients:  BoilerBrush.com.cyhttps://www.fda.gov/media/136312/download Fact Sheet for Healthcare Providers: https://pope.com/https://www.fda.gov/media/136313/download This test is not yet approved or cleared by the [place] FDA and has been authorized for detection and/or diagnosis of SARS-CoV-2 by FDA under an Emergency Use Authorization (EUA).  This EUA will remain in effect (meaning this test can be used) for the duration of the COVID-19 declaration under Section 564(b)(1) of the Act, 21 U.S.C. section 360bbb-3(b)(1), unless the authorization is terminated or revoked sooner. Performed at [facility], [address] [place], [place] [address]     Radiology Studies: Ct Head Wo Contrast  Result Date: 12-27-18 CLINICAL DATA:  Unwitnessed fall.  Altered mental status. EXAM: CT HEAD WITHOUT CONTRAST CT CERVICAL SPINE WITHOUT CONTRAST TECHNIQUE: Multidetector CT imaging of the head and cervical spine was performed following the standard protocol without intravenous contrast. Multiplanar CT image reconstructions of the cervical spine were also generated. COMPARISON:  None. FINDINGS: CT HEAD  FINDINGS Brain: Mild diffuse cortical atrophy is noted. Mild chronic ischemic white matter disease is noted. 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 No mass effect or midline shift is noted. Ventricular size is within normal limits. There is no evidence of mass lesion, hemorrhage or acute infarction. Vascular: No hyperdense vessel or unexpected calcification. Skull: Normal. Negative  for fracture or focal lesion. Sinuses/Orbits: No acute finding. Other: None. CT CERVICAL SPINE FINDINGS Alignment: Normal. Skull base and vertebrae: No acute fracture. No primary bone lesion or focal pathologic process. Soft tissues and spinal canal: No prevertebral fluid or swelling. No visible canal hematoma. Disc levels: Severe degenerative disc disease is noted at C3-4, C4-5, C5-6, C6-7 and C7-T1 with anterior osteophyte formation. Upper chest: Negative. Other: None. IMPRESSION: Mild diffuse cortical atrophy. Mild chronic ischemic white matter disease. No acute intracranial abnormality seen. Severe multilevel degenerative disc disease. No acute abnormality seen in the cervical spine. Electronically Signed   By: Lupita RaiderJames  Green Jr M.D.   On: 01/30/2019 13:46   Ct Cervical Spine Wo Contrast  Result Date: 02/18/2019 CLINICAL DATA:  Unwitnessed fall.  Altered mental status. EXAM: CT HEAD WITHOUT CONTRAST CT CERVICAL SPINE WITHOUT CONTRAST TECHNIQUE: Multidetector CT imaging of the head and cervical spine was performed following the standard protocol without intravenous contrast. Multiplanar CT image reconstructions of the cervical spine were also generated. COMPARISON:  None. FINDINGS: CT HEAD FINDINGS Brain: Mild diffuse cortical atrophy is noted. Mild chronic ischemic white matter disease is noted. 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 No mass effect or midline shift is noted. Ventricular size is within normal limits. There is no evidence of mass lesion, hemorrhage or acute infarction. Vascular: No hyperdense vessel or  unexpected calcification. Skull: Normal. Negative for fracture or focal lesion. Sinuses/Orbits: No acute finding. Other: None. CT CERVICAL SPINE FINDINGS Alignment: Normal. Skull base and vertebrae: No acute fracture. No primary bone lesion or focal pathologic process. Soft tissues and spinal canal: No prevertebral fluid or swelling. No visible canal hematoma. Disc levels: Severe degenerative disc disease is noted at C3-4, C4-5, C5-6, C6-7 and C7-T1 with anterior osteophyte formation. Upper chest: Negative. Other: None. IMPRESSION: Mild diffuse cortical atrophy. Mild chronic ischemic white matter disease. No acute intracranial abnormality seen. Severe multilevel degenerative disc disease. No acute abnormality seen in the cervical spine. Electronically Signed   By: Lupita RaiderJames  Green Jr M.D.   On: 02/18/2019 13:46   Dg Chest Portable 1 View  Result Date: 02/24/2019 CLINICAL DATA:  Unwitnessed fall.  Altered mental status. EXAM: PORTABLE CHEST 1 VIEW COMPARISON:  None. FINDINGS: The heart size and mediastinal contours are within normal limits. Both lungs are clear. The visualized skeletal structures are unremarkable. IMPRESSION: No active disease. Electronically Signed   By: Duanne GuessNicholas  Plundo M.D.   On: 02/21/2019 15:09   Scheduled Meds:  apixaban  2.5 mg Oral BID   insulin aspart  0-5 Units Subcutaneous QHS   insulin aspart  0-9 Units Subcutaneous TID WC   insulin aspart  10 Units Subcutaneous Once   insulin detemir  10 Units Subcutaneous BID   magnesium oxide  400 mg Oral QODAY   metoprolol succinate  50 mg Oral Daily   rosuvastatin  10 mg Oral QHS   senna  1 tablet Oral BID   vitamin B-12  1,000 mcg Oral Daily   Continuous Infusions:  dextrose 75 mL/hr at 02/09/19 1500    LOS: 0 days   Merlene Laughtermair Latif Takeela Peil, DO [facility] PAGER is on AMION  If 7PM-7AM, please contact night-coverage [email or web address] Password [email or web address] 02/09/2019, 3:20 PM

## 2019-02-09 NOTE — Evaluation (Signed)
Occupational Therapy Evaluation Patient Details Name: Stephen Hester. MRN: [id number] DOB: Dec 24, 1928 Today's Date: 02/09/2019    History of Present Illness Pt admitted with AMS 2* metabolic encephalopathy.  Pt wtih hx of dementia and a-fib   Clinical Impression   This 85 year old man was admitted for the above.  At baseline, he walked with a walker and usually needed assist with bathing and dressing (per daughter as told to [facility]).  Pt was cooperative during evaluation, although he verbalized little.  Will follow in acute setting with the goals listed below.   Follow Up Recommendations  SNF(vs ALF, if they can provide needed assistance)    Equipment Recommendations  None recommended by OT    Recommendations for Other Services       Precautions / Restrictions Precautions Precautions: Fall Restrictions Weight Bearing Restrictions: No      Mobility Bed Mobility Overal bed mobility: Needs Assistance Bed Mobility: Supine to Sit     Supine to sit: Mod assist     General bed mobility comments: pt following basic cues; reaches out for assist to move to sitting  Transfers Overall transfer level: Needs assistance Equipment used: Rolling walker (2 wheeled) Transfers: Sit to/from Stand Sit to Stand: Mod assist;+2 physical assistance;+2 safety/equipment;From elevated surface         General transfer comment: assist to bring wt up and fwd and to balance in standing with RW    Balance Overall balance assessment: Needs assistance Sitting-balance support: Feet supported;No upper extremity supported Sitting balance-Leahy Scale: Fair     Standing balance support: Bilateral upper extremity supported Standing balance-Leahy Scale: Poor                             ADL either performed or assessed with clinical judgement   ADL Overall ADL's : Needs assistance/impaired Eating/Feeding: Minimal assistance;Set up   Grooming: Minimal assistance                    Toilet Transfer: Moderate assistance;+2 for safety/equipment;Ambulation(bed)             General ADL Comments: pt needed multimodal cues to clean hands.  Based on clinical judgment, pt needs max A for bathing/dressing at this time     Vision         Perception     Praxis      Pertinent Vitals/Pain Pain Assessment: Faces Pain Score: 0-No pain Faces Pain Scale: No hurt     Hand Dominance     Extremity/Trunk Assessment Upper Extremity Assessment Upper Extremity Assessment: RUE deficits/detail;LUE deficits/detail RUE Deficits / Details: uses both hands on RW and to clean hands.  Tended to reach for lines/tubes   Lower Extremity Assessment Lower Extremity Assessment: Difficult to assess due to impaired cognition   Cervical / Trunk Assessment Cervical / Trunk Assessment: Kyphotic   Communication Communication Communication: Expressive difficulties(minimal verbalization)   Cognition Arousal/Alertness: Awake/alert Behavior During Therapy: Flat affect;Impulsive Overall Cognitive Status: History of cognitive impairments.  Per daughter, pt is usually oriented.  He tends to not talk when in hospital, although he did verbalize a little here. Followed basic commands with extra time and multimodal cues. Needed assist to manage RW during turns.Tended to reach out and grab lines and tubes  General Comments       Exercises     Shoulder Instructions      Home Living Family/patient expects to be discharged to:: Skilled nursing facility                                 Additional Comments: [place] is [place] [facility]  Before COVID, he [facility] receiving HHPT, OT and [facility].      Prior Functioning/Environment Level of Independence: Needs assistance        Comments: Pt walked with walker and usually needed help with bathing/dressing        OT Problem List: Decreased activity tolerance;Decreased  strength;Impaired balance (sitting and/or standing);Decreased cognition;Decreased safety awareness      OT Treatment/Interventions: Self-care/ADL training;Patient/family education;Balance training;Cognitive remediation/compensation;Therapeutic activities    OT Goals(Current goals can be found in the care plan section) Acute Rehab OT Goals Patient Stated Goal: No goals stated OT Goal Formulation: Patient unable to participate in goal setting Time For Goal Achievement: 01/29/2019 Potential to Achieve Goals: Fair  OT Frequency: Min 2X/week   Barriers to D/C:            Co-evaluation   Reason for Co-Treatment: For patient/therapist safety PT goals addressed during session: Mobility/safety with mobility OT goals addressed during session: ADL's and self-care      AM-PAC OT "6 Clicks" Daily Activity     Outcome Measure Help from another person eating meals?: A Little Help from another person taking care of personal grooming?: A Little Help from another person toileting, which includes using toliet, bedpan, or urinal?: A Lot Help from another person bathing (including washing, rinsing, drying)?: A Lot Help from another person to put on and taking off regular upper body clothing?: A Lot Help from another person to put on and taking off regular lower body clothing?: A Lot 6 Click Score: 14   End of Session    Activity Tolerance: Patient tolerated treatment well Patient left: in bed;with call bell/phone within reach;with restraints reapplied;with nursing/sitter in room  OT Visit Diagnosis: Unsteadiness on feet (R26.81)                Time: [id number] OT Time Calculation (min): 27 min Charges:  OT General Charges $OT Visit: 1 Visit OT Evaluation $OT Eval Low Complexity: 1 Low  Stephen Hester, OTR/L Acute Rehabilitation Services [phone] pager [phone] office 02/09/2019  Stephen Hester 02/09/2019, 3:39 PM

## 2019-02-09 NOTE — Evaluation (Signed)
Clinical/Bedside Swallow Evaluation Patient Details  Name: Stephen Hester. MRN: [id number] Date of Birth: September 13, 1928  Today's Date: 02/09/2019 Time: SLP Start Time (ACUTE ONLY): 1445 SLP Stop Time (ACUTE ONLY): 1505 SLP Time Calculation (min) (ACUTE ONLY): 20 min  Past Medical History: No past medical history on file. Past Surgical History: No past surgical history on file. HPI:  Stephen Hester. is a 85 y.o. male with history of diabetes, dementia, hypertension and A. fib on Eliquis presenting with unwitnessed fall and altered mental status.   Assessment / Plan / Recommendation Clinical Impression  Pt in bed, alert, bilateral mitts and safety belt in place. Pt presents with one lower tooth, and is otherwise edentulous. No dentures seen in room. Pt accepted trials of thin liquid, puree, and solid textures. Extended oral prep and residue noted after solid. Thin liquid and puree tolerated well without oral residue, anterior leakage, or overt s/s aspiration. Recommend downgrade solids to Dys2 (finely chopped) given lack of dentition and history of dementia. Continue thin liquids and meds in puree. Safe swallow precautions posted at [facility] and RN informed. Please reconsult if needs arise. No further ST intervention recommended at this time.   SLP Visit Diagnosis: Dysphagia, unspecified (R13.10)    Aspiration Risk  Mild aspiration risk    Diet Recommendation Dysphagia 2 (Fine chop);Thin liquid   Liquid Administration via: Cup;Straw Medication Administration: Whole meds with puree Supervision: Full supervision/cueing for compensatory strategies;Comment(1:1 assist with all po intake) Compensations: Minimize environmental distractions;Slow rate;Small sips/bites;Follow solids with liquid Postural Changes: Remain upright for at least 30 minutes after po intake;Seated upright at 90 degrees    Other  Recommendations Oral Care Recommendations: Oral care BID   Follow up Recommendations 24 hour  supervision/assistance          Prognosis Prognosis for Safe Diet Advancement: Guarded Barriers to Reach Goals: Cognitive deficits      Swallow Study   General Date of Onset: 02/04/2019 HPI: Stephen Hester. is a 85 y.o. male with history of diabetes, dementia, hypertension and A. fib on Eliquis presenting with unwitnessed fall and altered mental status. Type of Study: Bedside Swallow Evaluation Previous Swallow Assessment: none Diet Prior to this Study: Regular;Thin liquids Temperature Spikes Noted: No Respiratory Status: Room air History of Recent Intubation: No Behavior/Cognition: Alert;Cooperative Oral Cavity Assessment: Within Functional Limits Oral Care Completed by SLP: No Oral Cavity - Dentition: Edentulous(1 lower tooth noted) Self-Feeding Abilities: Total assist Patient Positioning: Upright in bed Baseline Vocal Quality: Normal Volitional Cough: Cognitively unable to elicit Volitional Swallow: Unable to elicit    Oral/Motor/Sensory Function Overall Oral Motor/Sensory Function: (unable to formally assess- pt unable to follow commands)   Ice Chips Ice chips: Not tested   Thin Liquid Thin Liquid: Within functional limits Presentation: Straw    Nectar Thick Nectar Thick Liquid: Not tested   Honey Thick Honey Thick Liquid: Not tested   Puree Puree: Within functional limits Presentation: Spoon   Solid     Solid: Impaired Oral Phase Impairments: Impaired mastication Oral Phase Functional Implications: Prolonged oral transit;Oral residue(RN reports pocketing of solids at meals) Pharyngeal Phase Impairments: Other (comments)(none)     Rykker Coviello B. Quentin Ore [facility], [place] Speech Language Pathologist [phone]  Shonna Chock 02/09/2019,3:08 PM

## 2019-02-09 NOTE — Evaluation (Signed)
Physical Therapy Evaluation Patient Details Name: Stephen Hester. MRN: [id number] DOB: Aug 03, 1928 Today's Date: 02/09/2019   History of Present Illness  Pt admitted with AMS 2* metabolic encephalopathy.  Pt wtih hx of dementia and a-fib  Clinical Impression  Pt admitted as above and presenting with functional mobility limitations 2* generalized weakness, balance deficits and cognitive deficits.  Pt would benefit from follow up care at previous facility to allow more familiar surroundings if facility can provide level of assist pt currently requires.    Follow Up Recommendations SNF    Equipment Recommendations  None recommended by PT    Recommendations for Other Services       Precautions / Restrictions Precautions Precautions: Fall Restrictions Weight Bearing Restrictions: No      Mobility  Bed Mobility Overal bed mobility: Needs Assistance Bed Mobility: Supine to Sit     Supine to sit: Mod assist     General bed mobility comments: pt following basic cues; reaches out for assist to move to sitting  Transfers Overall transfer level: Needs assistance Equipment used: Rolling walker (2 wheeled) Transfers: Sit to/from Stand Sit to Stand: Mod assist;+2 physical assistance;+2 safety/equipment;From elevated surface         General transfer comment: assist to bring wt up and fwd and to balance in standing with RW  Ambulation/Gait Ambulation/Gait assistance: Mod assist;+2 physical assistance;+2 safety/equipment Gait Distance (Feet): 140 Feet Assistive device: Rolling walker (2 wheeled) Gait Pattern/deviations: Step-through pattern;Decreased step length - right;Decreased step length - left;Shuffle;Trunk flexed;Drifts right/left;Narrow base of support Gait velocity: decr   General Gait Details: scissoring gait noted with R lean.  Cues for posture, position from RW and increased BOS - limited correction noted.  Physical assist for balance, support and RW  management  Stairs            Wheelchair Mobility    Modified Rankin (Stroke Patients Only)       Balance Overall balance assessment: Needs assistance Sitting-balance support: Feet supported;No upper extremity supported Sitting balance-Leahy Scale: Fair     Standing balance support: Bilateral upper extremity supported Standing balance-Leahy Scale: Poor                               Pertinent Vitals/Pain Pain Assessment: Faces Pain Score: 0-No pain    Home Living Family/patient expects to be discharged to:: Skilled nursing facility                      Prior Function Level of Independence: Needs assistance         Comments: Pt is unable to provide information.  Per chart, pt used RW at facility     Hand Dominance        Extremity/Trunk Assessment   Upper Extremity Assessment Upper Extremity Assessment: Difficult to assess due to impaired cognition    Lower Extremity Assessment Lower Extremity Assessment: Difficult to assess due to impaired cognition    Cervical / Trunk Assessment Cervical / Trunk Assessment: Kyphotic  Communication   Communication: Expressive difficulties(minimal verbalization)  Cognition Arousal/Alertness: Awake/alert Behavior During Therapy: Flat affect;Impulsive Overall Cognitive Status: History of cognitive impairments - at baseline                                        General Comments      Exercises  Assessment/Plan    PT Assessment Patient needs continued PT services  PT Problem List Decreased strength;Decreased activity tolerance;Decreased balance;Decreased mobility;Decreased cognition;Decreased knowledge of use of DME       PT Treatment Interventions DME instruction;Gait training;Functional mobility training;Therapeutic activities;Therapeutic exercise;Cognitive remediation;Patient/family education    PT Goals (Current goals can be found in the Care Plan section)  Acute  Rehab PT Goals Patient Stated Goal: No goals stated PT Goal Formulation: Patient unable to participate in goal setting Time For Goal Achievement: 07/10/19 Potential to Achieve Goals: Fair    Frequency Min 3X/week   Barriers to discharge        Co-evaluation PT/OT/SLP Co-Evaluation/Treatment: Yes Reason for Co-Treatment: For patient/therapist safety PT goals addressed during session: Mobility/safety with mobility OT goals addressed during session: ADL's and self-care       AM-PAC PT "6 Clicks" Mobility  Outcome Measure Help needed turning from your back to your side while in a flat bed without using bedrails?: A Little Help needed moving from lying on your back to sitting on the side of a flat bed without using bedrails?: A Lot Help needed moving to and from a bed to a chair (including a wheelchair)?: A Lot Help needed standing up from a chair using your arms (e.g., wheelchair or bedside chair)?: A Lot Help needed to walk in hospital room?: A Lot Help needed climbing 3-5 steps with a railing? : Total 6 Click Score: 12    End of Session Equipment Utilized During Treatment: Gait belt Activity Tolerance: Patient tolerated treatment well Patient left: in bed;with call bell/phone within reach;with restraints reapplied;with bed alarm set Nurse Communication: Mobility status PT Visit Diagnosis: Difficulty in walking, not elsewhere classified (R26.2);Muscle weakness (generalized) (M62.81)    Time: [id number] PT Time Calculation (min) (ACUTE ONLY): 27 min   Charges:   PT Evaluation $PT Eval Low Complexity: 1 Low          Mauro KaufmannHunter Lanika [facility] Acute Rehabilitation Services Pager [phone] Office [phone]   Navdeep Halt 02/09/2019, 2:55 PM

## 2019-02-09 NOTE — Progress Notes (Signed)
Inpatient Diabetes Program Recommendations  AACE/ADA: New Consensus Statement on Inpatient Glycemic Control (2015)  Target Ranges:  Prepandial:   less than 140 mg/dL      Peak postprandial:   less than 180 mg/dL (1-2 hours)      Critically ill patients:  140 - 180 mg/dL   Lab Results  Component Value Date   GLUCAP 222 (H) 02/09/2019   HGBA1C 9.6 (H) 02/09/2019    Review of Glycemic Control  Diabetes history: DM2 Outpatient Diabetes medications: Levemir 27 units QHS, Novolog 5 units bid -with breakfast and dinner  Current orders for Inpatient glycemic control: Levemir 10 units bid, Novolog 0-9 units tidwc and hs  HgbA1C - 9.6% CBGs today - [phone] Will likely benefit from addition of meal coverage insulin  Inpatient Diabetes Program Recommendations:     Add Novolog 3 units tidwc for meal coverage insulin  Will continue to follow glucose trends.  Thank you. Lorenda Peck, RD, LDN, CDE Inpatient Diabetes Coordinator [phone]

## 2019-02-09 NOTE — TOC Initial Note (Signed)
Transition of Care (TOC) - Initial/Assessment Note    Patient Details  Name: Stephen Hester. MRN: [id number] Date of Birth: Dec 05, 1928  Transition of Care [facility]) CM/SW Contact:    Servando Snare, LCSW Phone Number: 02/09/2019, 3:10 PM  Clinical Narrative:                 Patient is from [place]. Prior to Covid patient received OT/PT and speech at [facility] the [facility]. At baseline daughter, Pamala Hurry reports that patient is alert and oriented. Patient uses a walker and sometimes needs assistance with ADLs. Daughter reports that it is not unusual for patient to not talk to medical staff without family present.   LCSW will follow for dc needs  Expected Discharge Plan: Assisted Living Barriers to Discharge: Continued Medical Work up   Patient Goals and CMS Choice        Expected Discharge Plan and Services Expected Discharge Plan: Assisted Living       Living arrangements for the past 2 months: [place]                                      Prior Living Arrangements/Services Living arrangements for the past 2 months: [place] Lives with:: Facility Resident Patient language and need for interpreter reviewed:: Yes        Need for Family Participation in Patient Care: Yes (Comment) Care giver support system in place?: Yes (comment) Current home services: DME Criminal Activity/Legal Involvement Pertinent to Current Situation/Hospitalization: No - Comment as needed  Activities of Daily Living      Permission Sought/Granted Permission sought to share information with : Family Supports Permission granted to share information with : No(not oriented)  Share Information with NAME: Pamala Hurry  Permission granted to share info w AGENCY: [facility]  Permission granted to share info w Relationship: daughters     Emotional Assessment Appearance:: Appears stated age Attitude/Demeanor/Rapport: Unable to Assess Affect (typically  observed): Unable to Assess Orientation: : (follows commands) Alcohol / Substance Use: Not Applicable Psych Involvement: No (comment)  Admission diagnosis:  Hypernatremia [E87.0] Hyperglycemia [R73.9] Elevated troponin [R79.89] AKI (acute kidney injury) ([place]) [N17.9] Altered mental status, unspecified altered mental status type [R41.82] Patient Active Problem List   Diagnosis Date Noted  . Acute encephalopathy 02/18/2019  . Chronic pain of right knee 09/07/2018   PCP:  [facility] [place]:  No Pharmacies Listed    Social Determinants of Health (SDOH) Interventions    Readmission Risk Interventions Readmission Risk Prevention Plan 02/09/2019  Transportation Screening Complete  Home Care Screening Complete

## 2019-02-10 LAB — CBC WITH DIFFERENTIAL/PLATELET
Abs Immature Granulocytes: 0.02 10*3/uL (ref 0.00–0.07)
Basophils Absolute: 0 10*3/uL (ref 0.0–0.1)
Basophils Relative: 0 %
Eosinophils Absolute: 0.1 10*3/uL (ref 0.0–0.5)
Eosinophils Relative: 1 %
HCT: 33.5 % — ABNORMAL LOW (ref 39.0–52.0)
Hemoglobin: 11 g/dL — ABNORMAL LOW (ref 13.0–17.0)
Immature Granulocytes: 0 %
Lymphocytes Relative: 32 %
Lymphs Abs: 2.3 10*3/uL (ref 0.7–4.0)
MCH: 31.1 pg (ref 26.0–34.0)
MCHC: 32.8 g/dL (ref 30.0–36.0)
MCV: 94.6 fL (ref 80.0–100.0)
Monocytes Absolute: 0.6 10*3/uL (ref 0.1–1.0)
Monocytes Relative: 8 %
Neutro Abs: 4.1 10*3/uL (ref 1.7–7.7)
Neutrophils Relative %: 59 %
Platelets: 104 10*3/uL — ABNORMAL LOW (ref 150–400)
RBC: 3.54 MIL/uL — ABNORMAL LOW (ref 4.22–5.81)
RDW: 12.6 % (ref 11.5–15.5)
WBC: 7.2 10*3/uL (ref 4.0–10.5)
nRBC: 0 % (ref 0.0–0.2)

## 2019-02-10 LAB — COMPREHENSIVE METABOLIC PANEL
ALT: 14 U/L (ref 0–44)
AST: 37 U/L (ref 15–41)
Albumin: 3.7 g/dL (ref 3.5–5.0)
Alkaline Phosphatase: 57 U/L (ref 38–126)
Anion gap: 7 (ref 5–15)
BUN: 43 mg/dL — ABNORMAL HIGH (ref 8–23)
CO2: 24 mmol/L (ref 22–32)
Calcium: 8.7 mg/dL — ABNORMAL LOW (ref 8.9–10.3)
Chloride: 112 mmol/L — ABNORMAL HIGH (ref 98–111)
Creatinine, Ser: 1.66 mg/dL — ABNORMAL HIGH (ref 0.61–1.24)
GFR calc Af Amer: 42 mL/min — ABNORMAL LOW (ref >60)
GFR calc non Af Amer: 37 mL/min — ABNORMAL LOW (ref >60)
Glucose, Bld: 136 mg/dL — ABNORMAL HIGH (ref 70–99)
Potassium: 3.1 mmol/L — ABNORMAL LOW (ref 3.5–5.1)
Sodium: 143 mmol/L (ref 135–145)
Total Bilirubin: 0.5 mg/dL (ref 0.3–1.2)
Total Protein: 6 g/dL — ABNORMAL LOW (ref 6.5–8.1)

## 2019-02-10 LAB — GLUCOSE, CAPILLARY
Glucose-Capillary: 100 mg/dL — ABNORMAL HIGH (ref 70–99)
Glucose-Capillary: 179 mg/dL — ABNORMAL HIGH (ref 70–99)
Glucose-Capillary: 219 mg/dL — ABNORMAL HIGH (ref 70–99)
Glucose-Capillary: 270 mg/dL — ABNORMAL HIGH (ref 70–99)

## 2019-02-10 LAB — MAGNESIUM: Magnesium: 2 mg/dL (ref 1.7–2.4)

## 2019-02-10 LAB — PHOSPHORUS: Phosphorus: 2.8 mg/dL (ref 2.5–4.6)

## 2019-02-10 MED ORDER — SODIUM CHLORIDE 0.9% FLUSH
10.0000 mL | INTRAVENOUS | Status: DC | PRN
  Administered 2019-02-22: 10 mL
  Filled 2019-02-10: qty 40

## 2019-02-10 MED ORDER — IPRATROPIUM-ALBUTEROL 0.5-2.5 (3) MG/3ML IN SOLN
3.0000 mL | Freq: Four times a day (QID) | RESPIRATORY_TRACT | Status: DC
  Administered 2019-02-10: 20:00:00 3 mL via RESPIRATORY_TRACT
  Filled 2019-02-10: qty 3

## 2019-02-10 MED ORDER — INSULIN ASPART 100 UNIT/ML ~~LOC~~ SOLN
3.0000 [IU] | Freq: Three times a day (TID) | SUBCUTANEOUS | Status: DC
  Administered 2019-02-10 – 2019-02-15 (×14): 3 [IU] via SUBCUTANEOUS

## 2019-02-10 MED ORDER — POTASSIUM CHLORIDE 10 MEQ/100ML IV SOLN
10.0000 meq | INTRAVENOUS | Status: DC
  Filled 2019-02-10: qty 100

## 2019-02-10 MED ORDER — POTASSIUM CHLORIDE CRYS ER 20 MEQ PO TBCR
40.0000 meq | EXTENDED_RELEASE_TABLET | Freq: Once | ORAL | Status: AC
  Administered 2019-02-10: 09:00:00 40 meq via ORAL
  Filled 2019-02-10: qty 2

## 2019-02-10 MED ORDER — POTASSIUM CHLORIDE CRYS ER 20 MEQ PO TBCR
40.0000 meq | EXTENDED_RELEASE_TABLET | Freq: Once | ORAL | Status: AC
  Administered 2019-02-10: 12:00:00 40 meq via ORAL

## 2019-02-10 MED ORDER — HALOPERIDOL LACTATE 5 MG/ML IJ SOLN
1.0000 mg | Freq: Once | INTRAMUSCULAR | Status: DC
  Filled 2019-02-10: qty 1

## 2019-02-10 MED ORDER — HALOPERIDOL LACTATE 5 MG/ML IJ SOLN
2.0000 mg | Freq: Once | INTRAMUSCULAR | Status: AC
  Administered 2019-02-10: 2 mg via INTRAMUSCULAR

## 2019-02-10 MED ORDER — SODIUM CHLORIDE 0.9% FLUSH
10.0000 mL | Freq: Two times a day (BID) | INTRAVENOUS | Status: DC
  Administered 2019-02-16 – 2019-02-22 (×3): 10 mL

## 2019-02-10 NOTE — Progress Notes (Signed)
PROGRESS NOTE    Stephen MuslimAlex Leak Jr.  JXB:[id number] DOB: 1928-08-11 DOA: 02/05/2019 [facility]   Brief Narrative:  HPI per Dr. Candelaria Stagersaye Gonfa on 02/15/2019 Stephen MuslimAlex Leak Jr. is a 85 y.o. male with history of diabetes, dementia, hypertension and A. fib on Eliquis presenting with unwitnessed fall and altered mental status.   Patient was not able to provide history due to encephalopathy/dementia.  History based on chart review and discussion with EDP.  Minimally follows command.  Does not appear to be in distress.  Trying to get out of the bed.  Responds no to pain.  Called and talked to patient's daughter who had a call from facility saying he had unwitnessed fall.  She was told his eyes were rolling backward.  She did not have more detail.  She confirms with me that he is full code.  Per EMS, not able to do stroke screen due to encephalopathy.  Was given 500 cc normal saline bolus.  Vital signs not impressive.  CBG elevated to 378.  In ED, hemodynamically stable.  100% on room air.  Hgb 12.7.  MCV 100.  Platelet 124.  Sodium 152.  Bicarb 17.  Glucose 235.  BUN 60.  Creatinine 2.99 (no baseline to compare to).  Anion gap 16.  Otherwise CMP not impressive.  High-sensitivity troponin 36.  EKG with sinus rhythm, PVCs, TW bilaterally and QTC to 580s.  Urinalysis with glucose greater than 500.  No ketones.  Portable CXR, CT head and cervical spine without acute finding.  COVID-19 negative.  Was given normal saline bolus 1 L and hospitalist service was called for admission for encephalopathy and fall.  **Interim History  Patient was somnolent on my examination yesterday and remained altered.  Closed eyes for most of examination but open from a briefly and then will respond to painful stimuli.  SLP done and recommending dysphagia 2 diet with thin liquids.  Creatinine is trending down with IV fluid resuscitation and fluid has been changed to D5W.  Today the patient was again somnolent but had  just received Haldol early in the morning pain agitation and he pulled out his IV and pulled off his mittens.  Creatinine is trending down still with Fluid resuscitation.   Assessment & Plan:   Active Problems:   Acute encephalopathy  Unwitnessed Fall at facility Acute metabolic encephalopathy in the setting of Prior Dementia Dehydration  -Suspect dehydration and mild DKA based on his labs.   -Initial Neuro exam reassuring also limited due to patient's inability to cooperate but today he was somnolent but arousable to painful stimulu.  He is not on sedating medications.  CT head and cervical spine without acute finding.  No findings to suggest infectious process at this time. -Hydrate with 0.45 NS at 150 cc/hours for 24 hours and this was changed to D5W at 75 mL/hr and will continue today; IV lines were removed so Midline was Inserted  -RN to swallow test and SLP evaluated and recommending Dysphagia 2 Diet with Thin Liquids and Puree Medications  -PT/OT to Evaluate and Treat and they are recommending SNF -Patient was again not as awake this AM and was sleeping but arousable to painful stimuli; Had just received Haldol early in the morning due to agitation and because he did not sleep and pulling off mittens and IV's   -Will place on Delirium Precautions   Paroxysmal A. Fib -In sinus rhythm currently. -Rate controlled with Metoprolol Succinate 50 mg po Daily  On  Eliquis for anticoagulation and dose was adjusted for Renal Fxn.   -CT head without acute finding. -Continue home meds  Hypernatremia and Hyperchloremia  -Na 152 on Admission and Likely due to dehydration. -IV fluid as above changed to D5W at 75 mL/hr  -Na+ is slowly improving and is now 143and Chloride is still 112 -Continue to Monitor and Trend -Repeat CMP in AM   AKI with Azotemia, improving  -Creatinine 2.99 (unknown baseline) and BUN 60 on admission .   -Likely prerenal etiology.   -He is also on low-dose lisinopril  and Aldactone at home and this has been held [facility]-Hydrate as above; BUN/creatinine is trending down and went from 60/2.99 and is now 43/1.66 -Hold Nephrotoxic Meds, avoid contrast dyes, and hypotension  -Continue to Monitor and Trend Renal Fxn; Repeat CMP in AM   Mild DKA/ Uncontrolled IDDM-2, improving  -Bicarb 17 with anion gap of 16.  Glucose 236.   -Repeat BMP showed CO2 was 24 and AG was 7 -On Levemir 27 units at bedtime but will change to 10 units BID -IV hydration as above -HbA1c was 9.6 -CBG's ranging from 100-237 -Started Sensitive Novolog SSI AC/HS and will continue to Monitor Blood Sugars Carefully  -Will need Diabetic Education Coordinator evaluation -Also added Novolog 3 Units TIDwm  Hypertension -Continue Home Metoprolol 50 mg po Daily   Elevated Troponin/abnormal EKG; Troponin Trending down -Likely due to dehydration and delayed clearance from AKI versus ACS.  -He has some TWI or lateral leads but no baseline EKG to compare to. -Trend high-sensitivity troponin and slightly trended up and went from 36 -> 50.0 -> 53.0 -> 42.0 -> 45.0  -Recheck EKG in the morning -Continue Metoprolol and Eliquis as above -Continue home statin -If significantly worsening will need Cardiology Evaluation   QTC Prolongation -QTC prolonged to 580 and repeat EKG showed QTc of 511 -Hold home Zoloft -Closely monitor electrolytes and replenish  -Repeat EKG now  -Magnesium.  Normocytic Anemia/Suspected Anemia of CKD -Patient's Hgb/hHct went from 12.7/41.3 -> 11.0/33.5 -In the setting of IVF and ? Dilutional Drop -Anemia panel was checked and showed an iron level of 61, U IBC of 145, TIBC of 206, saturation ratios of 30%, ferritin level 144, folate level 30.5, vitamin B12 of 1803 -Continue to monitor for signs and symptoms of bleeding; currently no overt bleeding noted -Repeat CBC in a.m.  Thrombocytopenia -Patient's platelet count dropped from 124,000 -> 104,000 -Continue to monitor for  signs and symptoms of bleeding Exline-suspected in the setting of dehydration but will need to look into other causes -Repeat CBC in a.m.  Hypokalemia -Patient's K+ was 3.1 -Replete with po KCl 40 mEQ x2; Started IV KCl but lost IV Access so was discontinued -Continue to Monitor and Replete as Necessary -Repeat CMP in AM   DVT prophylaxis: Anticoagulated with Apixaban 2.5 mg po BID  Code Status: FULL CODE Family Communication: No family present at bedside but updated daughter over the phone. Disposition Plan: Remain Inpatient for continued workup and treatment and D/C back to SNF when medically stable.  Consultants:   None   Procedures: None  Antimicrobials:  Anti-infectives (From admission, onward)   None     Subjective: Seen and examined at bedside and he was asleep again and difficult to arouse.  He had just received Haldol earlier this morning prior to my arrival.  Per nursing he was awake about half an hour prior to 1 hours later and he ate breakfast and was trying to converse with  the nursing staff.  Nursing reports that he was awake all night and extremely agitated and pulled out his IVs and mittens is off restraints.  No other concerns or plans at this time and daughter was updated over the telephone.  Objective: Vitals:   02/09/19 2056 02/10/19 0512 02/10/19 0936 02/10/19 1328  BP: (!) 161/70 (!) 153/88  (!) 127/40  Pulse: 60 (!) 50  (!) 57  Resp: 19 16  14   Temp: 98 F (36.7 C) 98.8 F (37.1 C)  98.6 F (37 C)  TempSrc: Oral Oral  Oral  SpO2: 99% 98% 98% 96%  Weight:      Height:        Intake/Output Summary (Last 24 hours) at 02/10/2019 1420 Last data filed at 02/10/2019 0500 Gross per 24 hour  Intake 772.98 ml  Output 800 ml  Net -27.02 ml   Filed Weights   02/09/19 1000  Weight: 78.2 kg   Examination: Physical Exam:  Constitutional: Nourished, well-developed overweight elderly African-American male in soft restraints who is somnolent and asleep  and adjus received Haldol early this morning. Eyes: Lids and conjunctiva normal.  Sclera anicteric ENMT: External ears and nose appear normal.  Grossly normal hearing.  Mucous members are getting more moist Neck: Appears supple no JVD Respiratory: Diminished auscultation bilaterally no patient wheezing, rales, rhonchi.  Patient is on tachypneic or using any accessory muscles to breathe Cardiovascular: Mildly bradycardic but no appreciable murmurs, rubs, gallops.  Has trace lower from edema Abdomen: Soft, nontender, distended second body habitus.  Bowel sounds present GU: Deferred Musculoskeletal: Contractures or cyanosis.  No joint deformities in the upper lower extremities Skin: Skin is warm and dry no appreciable rashes or lesions lymph disc evaluation Neurologic: Unable to participate in physical examination and follow commands but does respond to sternal rub Psychiatric: Impaired judgment insight.  Patient was not awake enough to participate in physical examination but nursing reports that he was earlier this morning.  Remains extremely somnolent today and had just received Haldol prior to my arrival  Data Reviewed: I have personally reviewed following labs and imaging studies  CBC: Recent Labs  Lab 01/31/2019 1240 02/06/2019 1857 02/09/19 0500 02/10/19 0528  WBC 8.4 7.7 7.3 7.2  NEUTROABS 6.1  --   --  4.1  HGB 12.7* 11.5* 11.0* 11.0*  HCT 41.3 36.8* 36.0* 33.5*  MCV 100.2* 100.3* 99.2 94.6  PLT 124* 115* 115* 104*   Basic Metabolic Panel: Recent Labs  Lab 02/12/2019 1240 02/16/2019 1857 02/09/19 0500 02/10/19 0528  NA 152* 151* 149* 143  K 4.8 4.0 3.6 3.1*  CL 119* 118* 119* 112*  CO2 17* 23 20* 24  GLUCOSE 235* 256* 170* 136*  BUN 60* 63* 57* 43*  CREATININE 2.99* 2.78*  2.75* 2.21* 1.66*  CALCIUM 9.2 8.9 8.7* 8.7*  MG 2.3 2.1  --  2.0  PHOS  --   --   --  2.8   GFR: Estimated Creatinine Clearance: 30.3 mL/min (A) (by C-G formula based on SCr of 1.66 mg/dL (H)). Liver  Function Tests: Recent Labs  Lab 02/20/2019 1240 02/10/19 0528  AST 37 37  ALT 15 14  ALKPHOS 65 57  BILITOT 0.4 0.5  PROT 6.6 6.0*  ALBUMIN 3.9 3.7   No results for input(s): LIPASE, AMYLASE in the last 168 hours. No results for input(s): AMMONIA in the last 168 hours. Coagulation Profile: No results for input(s): INR, PROTIME in the last 168 hours. Cardiac Enzymes: Recent Labs  Lab February 15, 2019 1857  CKTOTAL 607*   BNP (last 3 results) No results for input(s): PROBNP in the last 8760 hours. HbA1C: Recent Labs    02/09/19 0500  HGBA1C 9.6*   CBG: Recent Labs  Lab 02/09/19 1150 02/09/19 1636 02/09/19 2101 02/10/19 0821 02/10/19 1117  GLUCAP 237* 183* 222* 100* 179*   Lipid Profile: No results for input(s): CHOL, HDL, LDLCALC, TRIG, CHOLHDL, LDLDIRECT in the last 72 hours. Thyroid Function Tests: Recent Labs    2019/02/15 1857  TSH 0.696   Anemia Panel: Recent Labs    02/09/19 0841  VITAMINB12 1,803*  FOLATE 13.5  FERRITIN 144  TIBC 206*  IRON 61  RETICCTPCT 1.1   Sepsis Labs: No results for input(s): PROCALCITON, LATICACIDVEN in the last 168 hours.  Recent Results (from the past 240 hour(s))  Urine culture     Status: None   Collection Time: 02/15/2019 12:29 PM   Specimen: Urine, Random  Result Value Ref Range Status   Specimen Description   Final    URINE, RANDOM Performed at [facility] [address], [place], [place] [address]    Special Requests   Final    NONE Performed at [facility], [place] [address], [place], [place] [address]    Culture   Final    NO GROWTH Performed at [facility], [place] [address], [place], [place] [address]    Report Status 02/09/2019 FINAL  Final  SARS Coronavirus 2 (CEPHEID- Performed in [place] hospital lab), Hosp Order     Status: None   Collection Time: 15-Feb-2019  1:14 PM   Specimen: Nasopharyngeal Swab  Result Value Ref Range Status   SARS Coronavirus 2 NEGATIVE  NEGATIVE Final    Comment: (NOTE) If result is NEGATIVE SARS-CoV-2 target nucleic acids are NOT DETECTED. The SARS-CoV-2 RNA is generally detectable in upper and lower  respiratory specimens during the acute phase of infection. The lowest  concentration of SARS-CoV-2 viral copies this assay can detect is 250  copies / mL. A negative result does not preclude SARS-CoV-2 infection  and should not be used as the sole basis for treatment or other  patient management decisions.  A negative result may occur with  improper specimen collection / handling, submission of specimen other  than nasopharyngeal swab, presence of viral mutation(s) within the  areas targeted by this assay, and inadequate number of viral copies  (<250 copies / mL). A negative result must be combined with clinical  observations, patient history, and epidemiological information. If result is POSITIVE SARS-CoV-2 target nucleic acids are DETECTED. The SARS-CoV-2 RNA is generally detectable in upper and lower  respiratory specimens dur ing the acute phase of infection.  Positive  results are indicative of active infection with SARS-CoV-2.  Clinical  correlation with patient history and other diagnostic information is  necessary to determine patient infection status.  Positive results do  not rule out bacterial infection or co-infection with other viruses. If result is PRESUMPTIVE POSTIVE SARS-CoV-2 nucleic acids MAY BE PRESENT.   A presumptive positive result was obtained on the submitted specimen  and confirmed on repeat testing.  While 2019 novel coronavirus  (SARS-CoV-2) nucleic acids may be present in the submitted sample  additional confirmatory testing may be necessary for epidemiological  and / or clinical management purposes  to differentiate between  SARS-CoV-2 and other Sarbecovirus currently known to infect humans.  If clinically indicated additional testing with an alternate test  methodology [phone])  is  advised. The SARS-CoV-2 RNA is generally  detectable in upper and lower respiratory sp ecimens during the acute  phase of infection. The expected result is Negative. Fact Sheet for Patients:  BoilerBrush.com.cy Fact Sheet for Healthcare Providers: https://pope.com/ This test is not yet approved or cleared by the [place] FDA and has been authorized for detection and/or diagnosis of SARS-CoV-2 by FDA under an Emergency Use Authorization (EUA).  This EUA will remain in effect (meaning this test can be used) for the duration of the COVID-19 declaration under Section 564(b)(1) of the Act, 21 U.S.C. section 360bbb-3(b)(1), unless the authorization is terminated or revoked sooner. Performed at [facility], [address], [place], [place] [address]     Radiology Studies: Dg Chest Portable 1 View  Result Date: 01/29/2019 CLINICAL DATA:  Unwitnessed fall.  Altered mental status. EXAM: PORTABLE CHEST 1 VIEW COMPARISON:  None. FINDINGS: The heart size and mediastinal contours are within normal limits. Both lungs are clear. The visualized skeletal structures are unremarkable. IMPRESSION: No active disease. Electronically Signed   By: Duanne Guess M.D.   On: 02/15/2019 15:09   Scheduled Meds: . apixaban  2.5 mg Oral BID  . haloperidol lactate  1 mg Intravenous Once  . insulin aspart  0-5 Units Subcutaneous QHS  . insulin aspart  0-9 Units Subcutaneous TID WC  . insulin aspart  10 Units Subcutaneous Once  . insulin aspart  3 Units Subcutaneous TID WC  . insulin detemir  10 Units Subcutaneous BID  . magnesium oxide  400 mg Oral QODAY  . metoprolol succinate  50 mg Oral Daily  . rosuvastatin  10 mg Oral QHS  . senna  1 tablet Oral BID  . sodium chloride flush  10-40 mL Intracatheter Q12H  . vitamin B-12  1,000 mcg Oral Daily   Continuous Infusions: . dextrose 75 mL/hr at 02/10/19 1412    LOS: 1 day   Merlene Laughter, DO [facility] PAGER is on AMION  If 7PM-7AM, please contact night-coverage [email or web address] Password [email or web address] 02/10/2019, 2:20 PM

## 2019-02-10 NOTE — Progress Notes (Signed)
Patient disoriented X 4 on shift. Patient hasn't been sleep conisistenly all night.  Patient pulled Iv out. Mittens and wait restraint ineffective. Patient pulling at tele/lines and trying to get out of bed. No PRN ordered. Will continue to monitor.

## 2019-02-10 NOTE — Progress Notes (Signed)
xcover Per RN agitation, pulled iv out.  Pulled off mittens.   A/P Encephalopathy Haldol 1mg  iv x1 (RN unable to give due to pt pulling out iv), -> Haldol 2mg  im x1

## 2019-02-10 NOTE — Progress Notes (Signed)
Midline, 18gx8cm, placed in the right upper arm in the brachial vein. Great blood return. Patient tolerated the procedure very well.

## 2019-02-11 ENCOUNTER — Inpatient Hospital Stay (HOSPITAL_COMMUNITY): Payer: Medicare PPO

## 2019-02-11 DIAGNOSIS — E860 Dehydration: Secondary | ICD-10-CM

## 2019-02-11 DIAGNOSIS — E118 Type 2 diabetes mellitus with unspecified complications: Secondary | ICD-10-CM

## 2019-02-11 DIAGNOSIS — E878 Other disorders of electrolyte and fluid balance, not elsewhere classified: Secondary | ICD-10-CM

## 2019-02-11 DIAGNOSIS — J189 Pneumonia, unspecified organism: Principal | ICD-10-CM

## 2019-02-11 DIAGNOSIS — N179 Acute kidney failure, unspecified: Secondary | ICD-10-CM

## 2019-02-11 DIAGNOSIS — E1165 Type 2 diabetes mellitus with hyperglycemia: Secondary | ICD-10-CM

## 2019-02-11 DIAGNOSIS — E870 Hyperosmolality and hypernatremia: Secondary | ICD-10-CM

## 2019-02-11 LAB — CBC WITH DIFFERENTIAL/PLATELET
Abs Immature Granulocytes: 0.02 10*3/uL (ref 0.00–0.07)
Basophils Absolute: 0 10*3/uL (ref 0.0–0.1)
Basophils Relative: 1 %
Eosinophils Absolute: 0.2 10*3/uL (ref 0.0–0.5)
Eosinophils Relative: 3 %
HCT: 31.9 % — ABNORMAL LOW (ref 39.0–52.0)
Hemoglobin: 10.5 g/dL — ABNORMAL LOW (ref 13.0–17.0)
Immature Granulocytes: 0 %
Lymphocytes Relative: 29 %
Lymphs Abs: 1.9 10*3/uL (ref 0.7–4.0)
MCH: 30.6 pg (ref 26.0–34.0)
MCHC: 32.9 g/dL (ref 30.0–36.0)
MCV: 93 fL (ref 80.0–100.0)
Monocytes Absolute: 0.6 10*3/uL (ref 0.1–1.0)
Monocytes Relative: 9 %
Neutro Abs: 3.9 10*3/uL (ref 1.7–7.7)
Neutrophils Relative %: 58 %
Platelets: 87 10*3/uL — ABNORMAL LOW (ref 150–400)
RBC: 3.43 MIL/uL — ABNORMAL LOW (ref 4.22–5.81)
RDW: 12.8 % (ref 11.5–15.5)
WBC: 6.5 10*3/uL (ref 4.0–10.5)
nRBC: 0 % (ref 0.0–0.2)

## 2019-02-11 LAB — COMPREHENSIVE METABOLIC PANEL
ALT: 13 U/L (ref 0–44)
AST: 39 U/L (ref 15–41)
Albumin: 3.3 g/dL — ABNORMAL LOW (ref 3.5–5.0)
Alkaline Phosphatase: 60 U/L (ref 38–126)
Anion gap: 9 (ref 5–15)
BUN: 30 mg/dL — ABNORMAL HIGH (ref 8–23)
CO2: 19 mmol/L — ABNORMAL LOW (ref 22–32)
Calcium: 8.6 mg/dL — ABNORMAL LOW (ref 8.9–10.3)
Chloride: 111 mmol/L (ref 98–111)
Creatinine, Ser: 1.49 mg/dL — ABNORMAL HIGH (ref 0.61–1.24)
GFR calc Af Amer: 48 mL/min — ABNORMAL LOW (ref >60)
GFR calc non Af Amer: 42 mL/min — ABNORMAL LOW (ref >60)
Glucose, Bld: 203 mg/dL — ABNORMAL HIGH (ref 70–99)
Potassium: 4.4 mmol/L (ref 3.5–5.1)
Sodium: 139 mmol/L (ref 135–145)
Total Bilirubin: 0.6 mg/dL (ref 0.3–1.2)
Total Protein: 5.8 g/dL — ABNORMAL LOW (ref 6.5–8.1)

## 2019-02-11 LAB — GLUCOSE, CAPILLARY
Glucose-Capillary: 190 mg/dL — ABNORMAL HIGH (ref 70–99)
Glucose-Capillary: 206 mg/dL — ABNORMAL HIGH (ref 70–99)
Glucose-Capillary: 186 mg/dL — ABNORMAL HIGH (ref 70–99)
Glucose-Capillary: 206 mg/dL — ABNORMAL HIGH (ref 70–99)

## 2019-02-11 LAB — MAGNESIUM: Magnesium: 1.8 mg/dL (ref 1.7–2.4)

## 2019-02-11 LAB — PROCALCITONIN: Procalcitonin: <0.1 ng/mL

## 2019-02-11 LAB — PHOSPHORUS: Phosphorus: 2.8 mg/dL (ref 2.5–4.6)

## 2019-02-11 LAB — MRSA PCR SCREENING: MRSA by PCR: NEGATIVE

## 2019-02-11 MED ORDER — SODIUM CHLORIDE 0.9 % IV SOLN
2.0000 g | Freq: Two times a day (BID) | INTRAVENOUS | Status: DC
  Administered 2019-02-12 – 2019-02-17 (×12): 2 g via INTRAVENOUS
  Filled 2019-02-11 (×12): qty 2

## 2019-02-11 MED ORDER — GUAIFENESIN ER 600 MG PO TB12
1200.0000 mg | ORAL_TABLET | Freq: Two times a day (BID) | ORAL | Status: DC
  Administered 2019-02-11 – 2019-02-16 (×11): 1200 mg via ORAL
  Filled 2019-02-11 (×14): qty 2

## 2019-02-11 MED ORDER — SODIUM CHLORIDE 0.9 % IV SOLN
2.0000 g | Freq: Once | INTRAVENOUS | Status: AC
  Administered 2019-02-11: 15:00:00 2 g via INTRAVENOUS
  Filled 2019-02-11: qty 2

## 2019-02-11 NOTE — Progress Notes (Signed)
Pharmacy Antibiotic Note  Stephen Hester. is a 85 y.o. male admitted on 03-10-19 with altered mental status.  CXR now + developing PNA.  Pharmacy has been consulted for Cefepime dosing.  Plan: Cefepime 2gm IV q12h Monitor renal function and cx data   Height: 5\' 8"  (172.7 cm) Weight: 172 lb 4.8 oz (78.2 kg) IBW/kg (Calculated) : 68.4  Temp (24hrs), Avg:98.3 F (36.8 C), Min:97.8 F (36.6 C), Max:98.6 F (37 C)  Recent Labs  Lab 10-Mar-2019 1240 March 10, 2019 1857 02/09/19 0500 02/10/19 0528 02/11/19 0723  WBC 8.4 7.7 7.3 7.2 6.5  CREATININE 2.99* 2.78*  2.75* 2.21* 1.66* 1.49*    Estimated Creatinine Clearance: 33.8 mL/min (A) (by C-G formula based on SCr of 1.49 mg/dL (H)).    No Known Allergies  Antimicrobials this admission: 7/18 Cefepime >>   Dose adjustments this admission:  Microbiology results: 7/15 UCx: NG-F  7/15 COVID: negative Sputum:   MRSA PCR:  Legionella: S. pneumo:  Thank you for allowing pharmacy to be a part of this patient's care.  Biagio Borg 02/11/2019 1:21 PM

## 2019-02-11 NOTE — Progress Notes (Signed)
Pt. Is not able to follow directions to properly perform flutter valve or incentive spirometry at this time.  Patient will not even move his head upon request to properly position himself for deep breathing.

## 2019-02-11 NOTE — Progress Notes (Signed)
PROGRESS NOTE    Stephen MuslimAlex Leak Jr.  RUE:[id number] DOB: [birth year] DOA: June 08, 2019 [facility]CP: [facility]   Brief Narrative:  HPI per Dr. Candelaria Stagersaye Gonfa on June 08, 2019 Stephen MuslimAlex Leak Jr. is a 85 y.o. male with history of diabetes, dementia, hypertension and A. fib on Eliquis presenting with unwitnessed fall and altered mental status.   Patient was not able to provide history due to encephalopathy/dementia.  History based on chart review and discussion with EDP.  Minimally follows command.  Does not appear to be in distress.  Trying to get out of the bed.  Responds no to pain.  Called and talked to patient's daughter who had a call from facility saying he had unwitnessed fall.  She was told his eyes were rolling backward.  She did not have more detail.  She confirms with me that he is full code.  Per EMS, not able to do stroke screen due to encephalopathy.  Was given 500 cc normal saline bolus.  Vital signs not impressive.  CBG elevated to 378.  In ED, hemodynamically stable.  100% on room air.  Hgb 12.7.  MCV 100.  Platelet 124.  Sodium 152.  Bicarb 17.  Glucose 235.  BUN 60.  Creatinine 2.99 (no baseline to compare to).  Anion gap 16.  Otherwise CMP not impressive.  High-sensitivity troponin 36.  EKG with sinus rhythm, PVCs, TW bilaterally and QTC to 580s.  Urinalysis with glucose greater than 500.  No ketones.  Portable CXR, CT head and cervical spine without acute finding.  COVID-19 negative.  Was given normal saline bolus 1 L and hospitalist service was called for admission for encephalopathy and fall.  **Interim History   SLP done and recommending dysphagia 2 diet with thin liquids.  Creatinine is trending down with IV fluid resuscitation and fluid has been changed to D5W.  Yesterday the patient received Haldol for agitation.  Per nursing this morning he was more awake and he was talking but when I went to see him again he was sleeping.  I woke him up and he just stares at me and turned to go  back to sleep.  Did not speak with me at all.  But per nurse states he is doing okay this morning.  Assessment & Plan:   Active Problems:   Acute encephalopathy  Unwitnessed Fall at facility Acute metabolic encephalopathy in the setting of Prior Dementia and ? Infection  Dehydration from suspected PNA -Suspect dehydration and mild DKA based on his labs.   -Initial Neuro exam reassuring also limited due to patient's inability to cooperate but today he was somnolent but arousable to painful stimulu.  He is not on sedating medications.  CT head and cervical spine without acute finding.  No findings to suggest infectious process at this time. -Hydrate with 0.45 NS at 150 cc/hours for 24 hours and this was changed to D5W at 75 mL/hr and will continue but will reduce rate today to 50 mL/hr given continued improvement and his creatinine; IV lines were removed so Midline was Inserted and unfortunately midline is no longer working so we will need IV team to address -RN to swallow test and SLP evaluated and recommending Dysphagia 2 Diet with Thin Liquids and Puree Medications  -PT/OT to Evaluate and Treat and they are recommending SNF -Patient was again not as awake this AM and was sleeping but arousable to painful stimuli and actually opened his eyes and started at me. He was speaking to the nurse earlier;  Received Haldol early in the morning yesterday due to agitation and because he did not sleep and pulling off mittens and IV's -Dehydration is improving    -Will place on Delirium Precautions   Left Sided PNA, likely poA but masked by Significant Dehydration -CXR on Admission showed "The heart size and mediastinal contours are within normal limits. Both lungs are clear. The visualized skeletal structures are unremarkable." -CXR this AM showed "Left ventricular prominence. Aortic atherosclerosis. New development of patchy density in the mid and lower lungs bilaterally. Findings could represent developing  pneumonia or possibly heart failure. Pneumonia is favored. No visible effusion. Bony structures unremarkable." -Will Decrease IVF rate to 50 mL/hr x1 more day and then stop -C/w DuoNeb; Added scheduled but was changed to PRN by Respiratory -Start ABx with IV Cefepime and will hold off on starting IV Vancomycin but will check MRSA PCR -Will start Flutter Valve and Incentive Spirometry; Also add Guiafenesin 1200 mg po BID  -Check Procalcitonin Level; -Will need a Sputum Cx and check Strep and Legionella Urinary Ag -Will get patient OOB to Chair -SARS-CoV-2 Testing Negative   Paroxysmal A. Fib -In sinus rhythm currently. -Rate controlled with Metoprolol Succinate 50 mg po Daily  On Eliquis for anticoagulation and dose was adjusted for Renal Fxn.   -CT head without acute finding. -Continue home meds   Hypernatremia and Hyperchloremia, improved  -Na 152 on Admission and Likely due to dehydration. -IV fluid as above changed to D5W at 75 mL/hr  -Na+ is slowly improving and is now 139 and Chloride is still 111 -Continue to Monitor and Trend -Repeat CMP in AM   AKI with Azotemia, improving  -Creatinine 2.99 (unknown baseline) and BUN 60 on admission .   -Likely prerenal etiology.   -He is also on low-dose lisinopril and Aldactone at home and this has been held and will continue to Hold -Hydration as above; BUN/creatinine is trending down and went from 60/2.99 and is now 30/1.49 -Hold Nephrotoxic Meds, avoid contrast dyes, and hypotension  -Continue to Monitor and Trend Renal Fxn; Repeat CMP in AM   Mild DKA/ Uncontrolled IDDM-2, improving  -Bicarb 17 with anion gap of 16.  Glucose 236.   -Repeat CMP this AM showed CO2 was 19 and AG was 9 -On Levemir 27 units at bedtime but will change to 10 units BID -IV hydration as above -HbA1c was 9.6 -CBG's ranging from 190-270 -Started Sensitive Novolog SSI AC/HS and will continue to Monitor Blood Sugars Carefully  -Will need Diabetic  Education Coordinator evaluation -Also added Novolog 3 Units TIDwm  Hypertension -Continue Home Metoprolol 50 mg po Daily   Elevated Troponin/abnormal EKG; Troponin Trending down -Likely due to dehydration and delayed clearance from AKI versus ACS.  -He has some TWI or lateral leads but no baseline EKG to compare to. -Trend high-sensitivity troponin and slightly trended up and went from 36 -> 50.0 -> 53.0 -> 42.0 -> 45.0  -Recheck EKG in the morning -Continue Metoprolol and Eliquis as above -Continue home statin -If significantly worsening will need Cardiology Evaluation   QTC Prolongation -QTC prolonged to 580 and repeat EKG showed QTc of 511 -Hold home Zoloft -Closely monitor electrolytes and replenish  -Repeat EKG now  -Magnesium.  Normocytic Anemia/Suspected Anemia of CKD -Patient's Hgb/hHct went from 12.7/41.3 -> 11.0/33.5 -In the setting of IVF and ? Dilutional Drop -Anemia panel was checked and showed an iron level of 61, U IBC of 145, TIBC of 206, saturation ratios of 30%, ferritin level  144, folate level 30.5, vitamin B12 of 1803 -Continue to monitor for signs and symptoms of bleeding; currently no overt bleeding noted -Repeat CBC in a.m.  Thrombocytopenia -Patient's platelet count dropped from 124,000 -> 104,000 -> 87,000 -? Related to PNA -Continue to monitor for signs and symptoms of bleeding  -Suspected in the setting of dehydration but will need to look into other causes and likley PNA is playing a part  -Repeat CBC in a.m.  Hypokalemia -Patient's K+ was 3.1 and improved to 4.4 -Continue to Monitor and Replete as Necessary -Repeat CMP in AM   DVT prophylaxis: Anticoagulated with Apixaban 2.5 mg po BID  Code Status: FULL CODE Family Communication: No family present at bedside but updated daughter over the phone. Disposition Plan: Remain Inpatient for continued workup and treatment and D/C back to SNF when medically stable.  Consultants:    None   Procedures: None  Antimicrobials:  Anti-infectives (From admission, onward)   None     Subjective: Seen and examined at bedside and was again sleeping but this time did open his eyes to physical verbal stimuli and was awake for little bit but would not communicate fall back to sleep.  Nursing states that he is tolerating breakfast any refusing medications.  Denies any chest pain, answer dizziness.  Still wearing mitten restraints.  No other concerns or complaints at this time.  Objective: Vitals:   02/10/19 1930 02/10/19 2136 02/11/19 0511 02/11/19 1056  BP:  140/70 (!) 120/54 114/81  Pulse:  62 65 86  Resp:  16 (!) 9   Temp:  98.5 F (36.9 C) 97.8 F (36.6 C)   TempSrc:  Oral Oral   SpO2: 97% 100%    Weight:      Height:        Intake/Output Summary (Last 24 hours) at 02/11/2019 1249 Last data filed at 02/11/2019 0602 Gross per 24 hour  Intake 1153.15 ml  Output 975 ml  Net 178.15 ml   Filed Weights   02/09/19 1000  Weight: 78.2 kg   Examination: Physical Exam:  Constitutional: Well-nourished, well-developed overweight elderly African-American male currently no acute distress he is appearing calm and he was asleep when I walked in but woke up for a little bit but then fell back to sleep. Eyes: Lids and conjunctive are normal.  Sclera anicteric ENMT: External ears and nose appear normal.  Grossly normal hearing Neck: Appears supple no JVD Respiratory: Diminished auscultation bilaterally with minimal wheezing on the left side no appreciable rales, rhonchi.  He is not tachypneic or using any accessory muscles of breathing Cardiovascular: Regular rate and rhythm.  No appreciable murmurs, rubs, gallops.  Trace lower extremity edema Abdomen: Soft, nontender, distended slightly secondary body habitus.  Bowel sounds present GU: Deferred Musculoskeletal: No contractures or cyanosis.  No joint deformities in the upper lower extremities Skin: No appreciable rashes or  lesions on limited skin evaluation Neurologic: Follows with his eyes but does not participate in cranial nerve examination.  Falls back to sleep but nursing states that he was speaking with them this morning Psychiatric: Continues to have impaired judgment and insight.  He was again somnolent and drowsy but more awake today than he was yesterday.  Opens his eyes and tracks with his eyes but did not communicate with me today and appeared calm and not as anxious.  Data Reviewed: I have personally reviewed following labs and imaging studies  CBC: Recent Labs  Lab February 09, 2019 1240 2019/02/09 1857 02/09/19 0500 02/10/19 1610  02/11/19 0723  WBC 8.4 7.7 7.3 7.2 6.5  NEUTROABS 6.1  --   --  4.1 3.9  HGB 12.7* 11.5* 11.0* 11.0* 10.5*  HCT 41.3 36.8* 36.0* 33.5* 31.9*  MCV 100.2* 100.3* 99.2 94.6 93.0  PLT 124* 115* 115* 104* 87*   Basic Metabolic Panel: Recent Labs  Lab 2018/09/06 1240 2018/09/06 1857 02/09/19 0500 02/10/19 0528 02/11/19 0723  NA 152* 151* 149* 143 139  K 4.8 4.0 3.6 3.1* 4.4  CL 119* 118* 119* 112* 111  CO2 17* 23 20* 24 19*  GLUCOSE 235* 256* 170* 136* 203*  BUN 60* 63* 57* 43* 30*  CREATININE 2.99* 2.78*   2.75* 2.21* 1.66* 1.49*  CALCIUM 9.2 8.9 8.7* 8.7* 8.6*  MG 2.3 2.1  --  2.0 1.8  PHOS  --   --   --  2.8 2.8   GFR: Estimated Creatinine Clearance: 33.8 mL/min (A) (by C-G formula based on SCr of 1.49 mg/dL (H)). Liver Function Tests: Recent Labs  Lab 2018/09/06 1240 02/10/19 0528 02/11/19 0723  AST 37 37 39  ALT 15 14 13   ALKPHOS 65 57 60  BILITOT 0.4 0.5 0.6  PROT 6.6 6.0* 5.8*  ALBUMIN 3.9 3.7 3.3*   No results for input(s): LIPASE, AMYLASE in the last 168 hours. No results for input(s): AMMONIA in the last 168 hours. Coagulation Profile: No results for input(s): INR, PROTIME in the last 168 hours. Cardiac Enzymes: Recent Labs  Lab 2018/09/06 1857  CKTOTAL 607*   BNP (last 3 results) No results for input(s): PROBNP in the last 8760  hours. HbA1C: Recent Labs    02/09/19 0500  HGBA1C 9.6*   CBG: Recent Labs  Lab 02/10/19 1117 02/10/19 1709 02/10/19 2137 02/11/19 0718 02/11/19 1239  GLUCAP 179* 219* 270* 190* 206*   Lipid Profile: No results for input(s): CHOL, HDL, LDLCALC, TRIG, CHOLHDL, LDLDIRECT in the last 72 hours. Thyroid Function Tests: Recent Labs    2018/09/06 1857  TSH 0.696   Anemia Panel: Recent Labs    02/09/19 0841  VITAMINB12 1,803*  FOLATE 13.5  FERRITIN 144  TIBC 206*  IRON 61  RETICCTPCT 1.1   Sepsis Labs: No results for input(s): PROCALCITON, LATICACIDVEN in the last 168 hours.  Recent Results (from the past 240 hour(s))  Urine culture     Status: None   Collection Time: 2018/09/06 12:29 PM   Specimen: Urine, Random  Result Value Ref Range Status   Specimen Description   Final    URINE, RANDOM Performed at [facility], [address] [place], [place] [address]    Special Requests   Final    NONE Performed at [facility], [address] [place], [place] [address]    Culture   Final    NO GROWTH Performed at [facility], [address] [place], [place] [address]    Report Status 02/09/2019 FINAL  Final  SARS Coronavirus 2 (CEPHEID- Performed in [facility] hospital lab), Hosp Order     Status: None   Collection Time: 2018/09/06  1:14 PM   Specimen: Nasopharyngeal Swab  Result Value Ref Range Status   SARS Coronavirus 2 NEGATIVE NEGATIVE Final    Comment: (NOTE) If result is NEGATIVE SARS-CoV-2 target nucleic acids are NOT DETECTED. The SARS-CoV-2 RNA is generally detectable in upper and lower  respiratory specimens during the acute phase of infection. The lowest  concentration of SARS-CoV-2 viral copies this assay can detect is 250  copies / mL. A negative result does not preclude SARS-CoV-2 infection  and should not be used as the sole basis for treatment or other  patient management decisions.  A negative result  may occur with  improper specimen collection / handling, submission of specimen other  than nasopharyngeal swab, presence of viral mutation(s) within the  areas targeted by this assay, and inadequate number of viral copies  (<250 copies / mL). A negative result must be combined with clinical  observations, patient history, and epidemiological information. If result is POSITIVE SARS-CoV-2 target nucleic acids are DETECTED. The SARS-CoV-2 RNA is generally detectable in upper and lower  respiratory specimens dur ing the acute phase of infection.  Positive  results are indicative of active infection with SARS-CoV-2.  Clinical  correlation with patient history and other diagnostic information is  necessary to determine patient infection status.  Positive results do  not rule out bacterial infection or co-infection with other viruses. If result is PRESUMPTIVE POSTIVE SARS-CoV-2 nucleic acids MAY BE PRESENT.   A presumptive positive result was obtained on the submitted specimen  and confirmed on repeat testing.  While 2019 novel coronavirus  (SARS-CoV-2) nucleic acids may be present in the submitted sample  additional confirmatory testing may be necessary for epidemiological  and / or clinical management purposes  to differentiate between  SARS-CoV-2 and other Sarbecovirus currently known to infect humans.  If clinically indicated additional testing with an alternate test  methodology [phone]) is advised. The SARS-CoV-2 RNA is generally  detectable in upper and lower respiratory sp ecimens during the acute  phase of infection. The expected result is Negative. Fact Sheet for Patients:  BoilerBrush.com.cy Fact Sheet for Healthcare Providers: https://pope.com/ This test is not yet approved or cleared by the [place] FDA and has been authorized for detection and/or diagnosis of SARS-CoV-2 by FDA under an Emergency Use Authorization (EUA).   This EUA will remain in effect (meaning this test can be used) for the duration of the COVID-19 declaration under Section 564(b)(1) of the Act, 21 U.S.C. section 360bbb-3(b)(1), unless the authorization is terminated or revoked sooner. Performed at [facility], [address], [place], [place] [address]     Radiology Studies: Dg Chest Port 1 View  Result Date: 02/11/2019 CLINICAL DATA:  Wheezing EXAM: PORTABLE CHEST 1 VIEW COMPARISON:  14-Feb-2019 FINDINGS: Left ventricular prominence. Aortic atherosclerosis. New development of patchy density in the mid and lower lungs bilaterally. Findings could represent developing pneumonia or possibly heart failure. Pneumonia is favored. No visible effusion. Bony structures unremarkable. IMPRESSION: Developing patchy density in the mid and lower lungs left worse than right most consistent with pneumonia. Electronically Signed   By: Paulina Fusi M.D.   On: 02/11/2019 10:39   Scheduled Meds:  apixaban  2.5 mg Oral BID   haloperidol lactate  1 mg Intravenous Once   insulin aspart  0-5 Units Subcutaneous QHS   insulin aspart  0-9 Units Subcutaneous TID WC   insulin aspart  10 Units Subcutaneous Once   insulin aspart  3 Units Subcutaneous TID WC   insulin detemir  10 Units Subcutaneous BID   magnesium oxide  400 mg Oral QODAY   metoprolol succinate  50 mg Oral Daily   rosuvastatin  10 mg Oral QHS   senna  1 tablet Oral BID   sodium chloride flush  10-40 mL Intracatheter Q12H   vitamin B-12  1,000 mcg Oral Daily   Continuous Infusions:  dextrose 75 mL/hr at 02/10/19  1412    LOS: 2 days   Merlene Laughtermair Latif Loye Reininger, DO [facility] PAGER is on AMION  If 7PM-7AM, please contact night-coverage [email or web address] Password [email or web address] 02/11/2019, 12:49 PM

## 2019-02-11 NOTE — Progress Notes (Signed)
Wait restraint order expired, patient removed from restraint. Patient is Disoriented X 4 this shift. Patient still pulling at lines and trying to get out of bed at times, however patient sleeps most of the night. Mittens on. Will continue to monitor.

## 2019-02-12 ENCOUNTER — Inpatient Hospital Stay (HOSPITAL_COMMUNITY): Payer: Medicare PPO

## 2019-02-12 DIAGNOSIS — R296 Repeated falls: Secondary | ICD-10-CM

## 2019-02-12 DIAGNOSIS — E875 Hyperkalemia: Secondary | ICD-10-CM

## 2019-02-12 LAB — CBC WITH DIFFERENTIAL/PLATELET
Abs Immature Granulocytes: 0.04 10*3/uL (ref 0.00–0.07)
Basophils Absolute: 0 10*3/uL (ref 0.0–0.1)
Basophils Relative: 0 %
Eosinophils Absolute: 0.2 10*3/uL (ref 0.0–0.5)
Eosinophils Relative: 3 %
HCT: 32.2 % — ABNORMAL LOW (ref 39.0–52.0)
Hemoglobin: 10.6 g/dL — ABNORMAL LOW (ref 13.0–17.0)
Immature Granulocytes: 1 %
Lymphocytes Relative: 25 %
Lymphs Abs: 1.8 10*3/uL (ref 0.7–4.0)
MCH: 31 pg (ref 26.0–34.0)
MCHC: 32.9 g/dL (ref 30.0–36.0)
MCV: 94.2 fL (ref 80.0–100.0)
Monocytes Absolute: 0.5 10*3/uL (ref 0.1–1.0)
Monocytes Relative: 7 %
Neutro Abs: 4.8 10*3/uL (ref 1.7–7.7)
Neutrophils Relative %: 64 %
Platelets: 89 10*3/uL — ABNORMAL LOW (ref 150–400)
RBC: 3.42 MIL/uL — ABNORMAL LOW (ref 4.22–5.81)
RDW: 12.9 % (ref 11.5–15.5)
WBC: 7.4 10*3/uL (ref 4.0–10.5)
nRBC: 0 % (ref 0.0–0.2)

## 2019-02-12 LAB — COMPREHENSIVE METABOLIC PANEL
ALT: 16 U/L (ref 0–44)
AST: 32 U/L (ref 15–41)
Albumin: 3.2 g/dL — ABNORMAL LOW (ref 3.5–5.0)
Alkaline Phosphatase: 60 U/L (ref 38–126)
Anion gap: 9 (ref 5–15)
BUN: 31 mg/dL — ABNORMAL HIGH (ref 8–23)
CO2: 20 mmol/L — ABNORMAL LOW (ref 22–32)
Calcium: 8.5 mg/dL — ABNORMAL LOW (ref 8.9–10.3)
Chloride: 109 mmol/L (ref 98–111)
Creatinine, Ser: 1.55 mg/dL — ABNORMAL HIGH (ref 0.61–1.24)
GFR calc Af Amer: 46 mL/min — ABNORMAL LOW (ref >60)
GFR calc non Af Amer: 40 mL/min — ABNORMAL LOW (ref >60)
Glucose, Bld: 239 mg/dL — ABNORMAL HIGH (ref 70–99)
Potassium: 4.3 mmol/L (ref 3.5–5.1)
Sodium: 138 mmol/L (ref 135–145)
Total Bilirubin: 0.4 mg/dL (ref 0.3–1.2)
Total Protein: 5.7 g/dL — ABNORMAL LOW (ref 6.5–8.1)

## 2019-02-12 LAB — PROCALCITONIN: Procalcitonin: <0.1 ng/mL

## 2019-02-12 LAB — GLUCOSE, CAPILLARY
Glucose-Capillary: 228 mg/dL — ABNORMAL HIGH (ref 70–99)
Glucose-Capillary: 264 mg/dL — ABNORMAL HIGH (ref 70–99)
Glucose-Capillary: 208 mg/dL — ABNORMAL HIGH (ref 70–99)
Glucose-Capillary: 87 mg/dL (ref 70–99)

## 2019-02-12 LAB — MAGNESIUM: Magnesium: 1.8 mg/dL (ref 1.7–2.4)

## 2019-02-12 LAB — PHOSPHORUS: Phosphorus: 2.7 mg/dL (ref 2.5–4.6)

## 2019-02-12 MED ORDER — DEXTROSE 5 % IV SOLN: INTRAVENOUS | Status: AC

## 2019-02-12 NOTE — Progress Notes (Signed)
PROGRESS NOTE    Stephen Hester.  ZOX:[id number] DOB: 1928/12/18 DOA: 02/07/2019 PCP: [facility]   Brief Narrative:  HPI per Dr. Candelaria Stagers on 01/29/2019 Stephen Hester. is a 85 y.o. male with history of diabetes, dementia, hypertension and A. fib on Eliquis presenting with unwitnessed fall and altered mental status.   Patient was not able to provide history due to encephalopathy/dementia.  History based on chart review and discussion with EDP.  Minimally follows command.  Does not appear to be in distress.  Trying to get out of the bed.  Responds no to pain.  Called and talked to patient's daughter who had a call from facility saying he had unwitnessed fall.  She was told his eyes were rolling backward.  She did not have more detail.  She confirms with me that he is full code.  Per EMS, not able to do stroke screen due to encephalopathy.  Was given 500 cc normal saline bolus.  Vital signs not impressive.  CBG elevated to 378.  In ED, hemodynamically stable.  100% on room air.  Hgb 12.7.  MCV 100.  Platelet 124.  Sodium 152.  Bicarb 17.  Glucose 235.  BUN 60.  Creatinine 2.99 (no baseline to compare to).  Anion gap 16.  Otherwise CMP not impressive.  High-sensitivity troponin 36.  EKG with sinus rhythm, PVCs, TW bilaterally and QTC to 580s.  Urinalysis with glucose greater than 500.  No ketones.  Portable CXR, CT head and cervical spine without acute finding.  COVID-19 negative.  Was given normal saline bolus 1 L and hospitalist service was called for admission for encephalopathy and fall.  **Interim History  SLP done and recommending dysphagia 2 diet with thin liquids.  Creatinine is trending down with IV fluid resuscitation and fluid has been changed to D5W and will continue today at 50 mL/hr. Slowly improving and more awake today.   Assessment & Plan:   Active Problems:   Acute encephalopathy  Unwitnessed Fall at facility Acute metabolic encephalopathy in the setting of  Prior Dementia and ? Infection from PNA Dehydration from suspected PNA -Suspect dehydration and mild DKA based on his labs.   -Initial Neuro exam reassuring also limited due to patient's inability to cooperate but today he was somnolent but arousable to painful stimulu.  He is not on sedating medications.  CT head and cervical spine without acute finding.  No findings to suggest infectious process at this time. -Hydrate with 0.45 NS at 150 cc/hours for 24 hours and this was changed to D5W at 75 mL/hr and will continue but will reduce rate today to 50 mL/hr given continued improvement with his Cr; Will resume for 12 more hours IV lines were removed so Midline was Inserted and unfortunately midline is no longer working so we will need IV team to address -RN to swallow test and SLP evaluated and recommending Dysphagia 2 Diet with Thin Liquids and Puree Medications  -PT/OT to Evaluate and Treat and they are recommending SNF -Patient was more awake to me this AM  -Dehydration is improving and patient is more awake and talking today  -Will place on Delirium Precautions  -Patient is slowly improving   Left Sided PNA, likely poA but masked by Significant Dehydration -CXR on Admission showed "The heart size and mediastinal contours are within normal limits. Both lungs are clear. The visualized skeletal structures are unremarkable." -CXR this AM showed "Left ventricular prominence. Aortic atherosclerosis. New development of patchy density  in the mid and lower lungs bilaterally. Findings could represent developing pneumonia or possibly heart failure. Pneumonia is favored. No visible effusion. Bony structures unremarkable." -Decreased IVF rate to 50 mL/hr x12 hours and then stop -C/w DuoNeb; Added scheduled but was changed to PRN by Respiratory -Start ABx with IV Cefepime and will hold off on starting IV Vancomycin but will check MRSA PCR -Will start Flutter Valve and Incentive Spirometry; Also add  Guiafenesin 1200 mg po BID  -Check Procalcitonin Level and was <0.10 x2 -Still coughing intermittently  -Will need a Sputum Cx and check Strep and Legionella Urinary Ag -Repeat CXR this AM showed "Low lung volumes. No active disease. No evidence of pneumonia or pulmonary edema." -Will get patient OOB to Chair -SARS-CoV-2 Testing Negative  -Continue to treat.   Paroxysmal A. Fib -In sinus rhythm currently. -Rate controlled with Metoprolol Succinate 50 mg po Daily  On Eliquis for anticoagulation and dose was adjusted for Renal Fxn.   -CT head without acute finding. -Continue home meds   Hypernatremia and Hyperchloremia, improved  -Na 152 on Admission and Likely due to dehydration. -IV fluid as above changed to D5W at 75 mL/hr  -Na+ is slowly improving and is now 138 and Chloride is still 109 -Continue to Monitor and Trend -Repeat CMP in AM   AKI with Azotemia, improving  -Creatinine 2.99 (unknown baseline) and BUN 60 on admission .   -Likely prerenal etiology.   -He is also on low-dose lisinopril and Aldactone at home and this has been held and will continue to Hold -Hydration as above; BUN/creatinine is trending down and went from 60/2.99 -> 30/1.49 -> 31/1.55 -Continue IVF as above at 50 mL/hr x12 hours -Hold Nephrotoxic Meds, avoid contrast dyes, and hypotension  -Continue to Monitor and Trend Renal Fxn; Repeat CMP in AM   Mild DKA/ Uncontrolled IDDM-2, improving  -Bicarb 17 with anion gap of 16.  Glucose 236.   -Repeat CMP this AM showed CO2 was 19 and AG was 9 -On Levemir 27 units at bedtime but will change to 10 units BID -IV hydration as above -HbA1c was 9.6 -CBG's ranging from 186-264 -Started Sensitive Novolog SSI AC/HS and will continue to Monitor Blood Sugars Carefully  -Will need Diabetic Education Coordinator evaluation -Also added Novolog 3 Units TIDwm  Hypertension -Continue Home Metoprolol 50 mg po Daily   Elevated Troponin/abnormal EKG; Troponin  Trending down -Likely due to dehydration and delayed clearance from AKI versus ACS.  -He has some TWI or lateral leads but no baseline EKG to compare to. -Trend high-sensitivity troponin and slightly trended up and went from 36 -> 50.0 -> 53.0 -> 42.0 -> 45.0  -Recheck EKG in the morning -Continue Metoprolol and Eliquis as above -Continue home statin -If significantly worsening will need Cardiology Evaluation   QTC Prolongation -QTC prolonged to 580 and repeat EKG this AM showed QTc of 470 -Hold home Zoloft -Closely monitor electrolytes and replenish  -Repeat EKG now  -Magnesium.  Normocytic Anemia/Suspected Anemia of CKD -Patient's Hgb/hHct went from 12.7/41.3 -> 10.6/32.2 -In the setting of IVF and ? Dilutional Drop -Anemia panel was checked and showed an iron level of 61, U IBC of 145, TIBC of 206, saturation ratios of 30%, ferritin level 144, folate level 30.5, vitamin B12 of 1803 -Continue to monitor for signs and symptoms of bleeding; currently no overt bleeding noted -Repeat CBC in a.m.  Thrombocytopenia -Patient's platelet count dropped from 124,000 -> 104,000 -> 87,000 -? Related to PNA -Continue to  monitor for signs and symptoms of bleeding  -Suspected in the setting of dehydration but will need to look into other causes and likley PNA is playing a part  -Repeat CBC in a.m.  Hypokalemia -Patient's K+ was 3.1 and improved to 4.3 -Continue to Monitor and Replete as Necessary -Repeat CMP in AM   DVT prophylaxis: Anticoagulated with Apixaban 2.5 mg po BID  Code Status: FULL CODE Family Communication: No family present at bedside but updated daughter over the phone. Disposition Plan: Remain Inpatient for continued workup and treatment and D/C back to SNF when medically stable.  Consultants:   None   Procedures: None  Antimicrobials:  Anti-infectives (From admission, onward)   Start     Dose/Rate Route Frequency Ordered Stop   02/12/19 0300  ceFEPIme (MAXIPIME)  2 g in sodium chloride 0.9 % 100 mL IVPB     2 g 200 mL/hr over 30 Minutes Intravenous Every 12 hours 02/11/19 1353     02/11/19 1330  ceFEPIme (MAXIPIME) 2 g in sodium chloride 0.9 % 100 mL IVPB     2 g 200 mL/hr over 30 Minutes Intravenous  Once 02/11/19 1310 02/11/19 1541     Subjective: Seen and examined at bedside and is much more awake and oriented to himself.  He is actually conversive with me for the first time today and awake enough to speak and have a conversation.  Denied pain.  Nursing states that he had a very large bowel movement just now.  No nausea or vomiting.  Still remains pleasantly demented.  No other concerns or complaints at this time.  Objective: Vitals:   02/11/19 1434 02/11/19 1800 02/11/19 2057 02/12/19 0446  BP: 131/74  (!) 115/51 123/73  Pulse: 86 88 76 74  Resp: 20  16 17   Temp: 97.8 F (36.6 C)  98.7 F (37.1 C) 98.8 F (37.1 C)  TempSrc: Oral  Oral Oral  SpO2:  100% 100% 100%  Weight:      Height:        Intake/Output Summary (Last 24 hours) at 02/12/2019 1335 Last data filed at 02/12/2019 0930 Gross per 24 hour  Intake -  Output 1700 ml  Net -1700 ml   Filed Weights   02/09/19 1000  Weight: 78.2 kg   Examination: Physical Exam:  Constitutional: Well-nourished, well-developed overweight elderly African-American male currently no acute distress appears calm and he is more awake today and does have abdominal getting cleaned up by the nurse and the nurse tech. Eyes: Lids and conjunctive are normal.  Sclera anicteric ENMT: External ears nose appear normal.  Grossly normal hearing Neck: Appears supple no JVD Respiratory: Slight diminished auscultation bilaterally no appreciable wheezing today and no appreciable rhonchi or rales.  Is not tachypneic or using accessory muscles to breathe but does cough intermittently and is nonproductive Cardiovascular: Regular rate and rhythm.  No appreciable murmurs, rubs, gallops.  Has mild lower extremity edema  Abdomen: Soft, non-trichomonacide secondary body habitus.  Bowel sounds present GU: Deferred Musculoskeletal: No contractures or cyanosis.  No joint deformities in upper and lower extremities Skin: Skin is warm and dry.  No appreciable rashes or lesions on limited evaluation Neurologic: Cranial nerves II through XII grossly intact no appreciable focal deficits. Psychiatric: He is awake and alert today and he is oriented x1.  More communicative today  Data Reviewed: I have personally reviewed following labs and imaging studies  CBC: Recent Labs  Lab 2019-07-27 1240 2019-07-27 1857 02/09/19 0500 02/10/19 0528 02/11/19  [id number] 02/12/19 0613  WBC 8.4 7.7 7.3 7.2 6.5 7.4  NEUTROABS 6.1  --   --  4.1 3.9 4.8  HGB 12.7* 11.5* 11.0* 11.0* 10.5* 10.6*  HCT 41.3 36.8* 36.0* 33.5* 31.9* 32.2*  MCV 100.2* 100.3* 99.2 94.6 93.0 94.2  PLT 124* 115* 115* 104* 87* 89*   Basic Metabolic Panel: Recent Labs  Lab 2018-08-20 1240 2018-08-20 1857 02/09/19 0500 02/10/19 0528 02/11/19 0723 02/12/19 0613  NA 152* 151* 149* 143 139 138  K 4.8 4.0 3.6 3.1* 4.4 4.3  CL 119* 118* 119* 112* 111 109  CO2 17* 23 20* 24 19* 20*  GLUCOSE 235* 256* 170* 136* 203* 239*  BUN 60* 63* 57* 43* 30* 31*  CREATININE 2.99* 2.78*  2.75* 2.21* 1.66* 1.49* 1.55*  CALCIUM 9.2 8.9 8.7* 8.7* 8.6* 8.5*  MG 2.3 2.1  --  2.0 1.8 1.8  PHOS  --   --   --  2.8 2.8 2.7   GFR: Estimated Creatinine Clearance: 32.5 mL/min (A) (by C-G formula based on SCr of 1.55 mg/dL (H)). Liver Function Tests: Recent Labs  Lab 2018-08-20 1240 02/10/19 0528 02/11/19 0723 02/12/19 0613  AST 37 37 39 32  ALT 15 14 13 16   ALKPHOS 65 57 60 60  BILITOT 0.4 0.5 0.6 0.4  PROT 6.6 6.0* 5.8* 5.7*  ALBUMIN 3.9 3.7 3.3* 3.2*   No results for input(s): LIPASE, AMYLASE in the last 168 hours. No results for input(s): AMMONIA in the last 168 hours. Coagulation Profile: No results for input(s): INR, PROTIME in the last 168 hours. Cardiac Enzymes:  Recent Labs  Lab 2018-08-20 1857  CKTOTAL 607*   BNP (last 3 results) No results for input(s): PROBNP in the last 8760 hours. HbA1C: No results for input(s): HGBA1C in the last 72 hours. CBG: Recent Labs  Lab 02/11/19 1239 02/11/19 1633 02/11/19 2055 02/12/19 0720 02/12/19 1115  GLUCAP 206* 186* 206* 228* 264*   Lipid Profile: No results for input(s): CHOL, HDL, LDLCALC, TRIG, CHOLHDL, LDLDIRECT in the last 72 hours. Thyroid Function Tests: No results for input(s): TSH, T4TOTAL, FREET4, T3FREE, THYROIDAB in the last 72 hours. Anemia Panel: No results for input(s): VITAMINB12, FOLATE, FERRITIN, TIBC, IRON, RETICCTPCT in the last 72 hours. Sepsis Labs: Recent Labs  Lab 02/11/19 1418 02/12/19 0613  PROCALCITON <0.10 <0.10    Recent Results (from the past 240 hour(s))  Urine culture     Status: None   Collection Time: 2018-08-20 12:29 PM   Specimen: Urine, Random  Result Value Ref Range Status   Specimen Description   Final    URINE, RANDOM Performed at [facility], [address] [place], [place] [address]    Special Requests   Final    NONE Performed at [facility], [address] [place], [place] [address]    Culture   Final    NO GROWTH Performed at [facility], [address] [place], [place] [address]    Report Status 02/09/2019 FINAL  Final  SARS Coronavirus 2 (CEPHEID- Performed in [facility] hospital lab), Hosp Order     Status: None   Collection Time: 2018-08-20  1:14 PM   Specimen: Nasopharyngeal Swab  Result Value Ref Range Status   SARS Coronavirus 2 NEGATIVE NEGATIVE Final    Comment: (NOTE) If result is NEGATIVE SARS-CoV-2 target nucleic acids are NOT DETECTED. The SARS-CoV-2 RNA is generally detectable in upper and lower  respiratory specimens during the acute phase  of infection. The lowest  concentration of SARS-CoV-2 viral copies this assay can detect is 250  copies / mL. A negative result does  not preclude SARS-CoV-2 infection  and should not be used as the sole basis for treatment or other  patient management decisions.  A negative result may occur with  improper specimen collection / handling, submission of specimen other  than nasopharyngeal swab, presence of viral mutation(s) within the  areas targeted by this assay, and inadequate number of viral copies  (<250 copies / mL). A negative result must be combined with clinical  observations, patient history, and epidemiological information. If result is POSITIVE SARS-CoV-2 target nucleic acids are DETECTED. The SARS-CoV-2 RNA is generally detectable in upper and lower  respiratory specimens dur ing the acute phase of infection.  Positive  results are indicative of active infection with SARS-CoV-2.  Clinical  correlation with patient history and other diagnostic information is  necessary to determine patient infection status.  Positive results do  not rule out bacterial infection or co-infection with other viruses. If result is PRESUMPTIVE POSTIVE SARS-CoV-2 nucleic acids MAY BE PRESENT.   A presumptive positive result was obtained on the submitted specimen  and confirmed on repeat testing.  While 2019 novel coronavirus  (SARS-CoV-2) nucleic acids may be present in the submitted sample  additional confirmatory testing may be necessary for epidemiological  and / or clinical management purposes  to differentiate between  SARS-CoV-2 and other Sarbecovirus currently known to infect humans.  If clinically indicated additional testing with an alternate test  methodology [phone]) is advised. The SARS-CoV-2 RNA is generally  detectable in upper and lower respiratory sp ecimens during the acute  phase of infection. The expected result is Negative. Fact Sheet for Patients:  BoilerBrush.com.cy Fact Sheet for Healthcare Providers: https://pope.com/ This test is not yet approved or  cleared by the [place] FDA and has been authorized for detection and/or diagnosis of SARS-CoV-2 by FDA under an Emergency Use Authorization (EUA).  This EUA will remain in effect (meaning this test can be used) for the duration of the COVID-19 declaration under Section 564(b)(1) of the Act, 21 U.S.C. section 360bbb-3(b)(1), unless the authorization is terminated or revoked sooner. Performed at [facility], [address] [place], [place] [address]   MRSA PCR Screening     Status: None   Collection Time: 02/11/19  1:03 PM   Specimen: Nasopharyngeal  Result Value Ref Range Status   MRSA by PCR NEGATIVE NEGATIVE Final    Comment:        The GeneXpert MRSA Assay (FDA approved for NASAL specimens only), is one component of a comprehensive MRSA colonization surveillance program. It is not intended to diagnose MRSA infection nor to guide or monitor treatment for MRSA infections. Performed at [facility], [address], [place], [place] [address]     Radiology Studies: Dg Chest Port 1 View  Result Date: 02/12/2019 CLINICAL DATA:  Shortness of breath. EXAM: PORTABLE CHEST 1 VIEW COMPARISON:  Chest x-rays dated 02/11/2019 and 02/18/2019. FINDINGS: Study is hypoinspiratory with crowding of the perihilar bronchovascular markings. Given the low lung volumes, lungs appear clear. No pleural effusion or pneumothorax seen. Heart size and mediastinal contours are stable. Osseous structures about the chest are unremarkable. IMPRESSION: Low lung volumes. No active disease. No evidence of pneumonia or pulmonary edema. Electronically Signed   By: Bary Richard M.D.   On: 02/12/2019 04:25   Dg Chest [facility] 1 View  Result  Date: 02/11/2019 CLINICAL DATA:  Wheezing EXAM: PORTABLE CHEST 1 VIEW COMPARISON:  01/31/2019 FINDINGS: Left ventricular prominence. Aortic atherosclerosis. New development of patchy density in the mid and lower lungs bilaterally. Findings  could represent developing pneumonia or possibly heart failure. Pneumonia is favored. No visible effusion. Bony structures unremarkable. IMPRESSION: Developing patchy density in the mid and lower lungs left worse than right most consistent with pneumonia. Electronically Signed   By: Nelson Chimes M.D.   On: 02/11/2019 10:39   Scheduled Meds: . apixaban  2.5 mg Oral BID  . guaiFENesin  1,200 mg Oral BID  . haloperidol lactate  1 mg Intravenous Once  . insulin aspart  0-5 Units Subcutaneous QHS  . insulin aspart  0-9 Units Subcutaneous TID WC  . insulin aspart  10 Units Subcutaneous Once  . insulin aspart  3 Units Subcutaneous TID WC  . insulin detemir  10 Units Subcutaneous BID  . magnesium oxide  400 mg Oral QODAY  . metoprolol succinate  50 mg Oral Daily  . rosuvastatin  10 mg Oral QHS  . senna  1 tablet Oral BID  . sodium chloride flush  10-40 mL Intracatheter Q12H  . vitamin B-12  1,000 mcg Oral Daily   Continuous Infusions: . ceFEPime (MAXIPIME) IV 2 g (02/12/19 0204)    LOS: 3 days   Kerney Elbe, DO [facility] PAGER is on [place]  If 7PM-7AM, please contact night-coverage [email or web address] Password [email or web address] 02/12/2019, 1:35 PM

## 2019-02-13 ENCOUNTER — Inpatient Hospital Stay (HOSPITAL_COMMUNITY): Payer: Medicare PPO

## 2019-02-13 ENCOUNTER — Other Ambulatory Visit: Payer: Self-pay

## 2019-02-13 ENCOUNTER — Encounter (HOSPITAL_COMMUNITY): Payer: Self-pay

## 2019-02-13 LAB — MAGNESIUM: Magnesium: 1.8 mg/dL (ref 1.7–2.4)

## 2019-02-13 LAB — CBC WITH DIFFERENTIAL/PLATELET
Abs Immature Granulocytes: 0.02 10*3/uL (ref 0.00–0.07)
Basophils Absolute: 0 10*3/uL (ref 0.0–0.1)
Basophils Relative: 0 %
Eosinophils Absolute: 0.3 10*3/uL (ref 0.0–0.5)
Eosinophils Relative: 4 %
HCT: 30.7 % — ABNORMAL LOW (ref 39.0–52.0)
Hemoglobin: 10.2 g/dL — ABNORMAL LOW (ref 13.0–17.0)
Immature Granulocytes: 0 %
Lymphocytes Relative: 24 %
Lymphs Abs: 1.7 10*3/uL (ref 0.7–4.0)
MCH: 31.3 pg (ref 26.0–34.0)
MCHC: 33.2 g/dL (ref 30.0–36.0)
MCV: 94.2 fL (ref 80.0–100.0)
Monocytes Absolute: 0.6 10*3/uL (ref 0.1–1.0)
Monocytes Relative: 9 %
Neutro Abs: 4.4 10*3/uL (ref 1.7–7.7)
Neutrophils Relative %: 63 %
Platelets: 83 10*3/uL — ABNORMAL LOW (ref 150–400)
RBC: 3.26 MIL/uL — ABNORMAL LOW (ref 4.22–5.81)
RDW: 13 % (ref 11.5–15.5)
WBC: 7 10*3/uL (ref 4.0–10.5)
nRBC: 0 % (ref 0.0–0.2)

## 2019-02-13 LAB — COMPREHENSIVE METABOLIC PANEL
ALT: 15 U/L (ref 0–44)
AST: 30 U/L (ref 15–41)
Albumin: 3.3 g/dL — ABNORMAL LOW (ref 3.5–5.0)
Alkaline Phosphatase: 53 U/L (ref 38–126)
Anion gap: 8 (ref 5–15)
BUN: 32 mg/dL — ABNORMAL HIGH (ref 8–23)
CO2: 19 mmol/L — ABNORMAL LOW (ref 22–32)
Calcium: 8.4 mg/dL — ABNORMAL LOW (ref 8.9–10.3)
Chloride: 111 mmol/L (ref 98–111)
Creatinine, Ser: 1.67 mg/dL — ABNORMAL HIGH (ref 0.61–1.24)
GFR calc Af Amer: 42 mL/min — ABNORMAL LOW (ref >60)
GFR calc non Af Amer: 36 mL/min — ABNORMAL LOW (ref >60)
Glucose, Bld: 125 mg/dL — ABNORMAL HIGH (ref 70–99)
Potassium: 4 mmol/L (ref 3.5–5.1)
Sodium: 138 mmol/L (ref 135–145)
Total Bilirubin: 0.6 mg/dL (ref 0.3–1.2)
Total Protein: 5.7 g/dL — ABNORMAL LOW (ref 6.5–8.1)

## 2019-02-13 LAB — GLUCOSE, CAPILLARY
Glucose-Capillary: 128 mg/dL — ABNORMAL HIGH (ref 70–99)
Glucose-Capillary: 154 mg/dL — ABNORMAL HIGH (ref 70–99)
Glucose-Capillary: 169 mg/dL — ABNORMAL HIGH (ref 70–99)
Glucose-Capillary: 306 mg/dL — ABNORMAL HIGH (ref 70–99)

## 2019-02-13 LAB — PHOSPHORUS: Phosphorus: 2.8 mg/dL (ref 2.5–4.6)

## 2019-02-13 LAB — PROCALCITONIN: Procalcitonin: <0.1 ng/mL

## 2019-02-13 NOTE — Progress Notes (Signed)
RN notified by CCMD that pt had 20 beat run Vtach, strip reviewed, and saved. Pt is sleeping comfortably. Will monitor.

## 2019-02-13 NOTE — Progress Notes (Signed)
PROGRESS NOTE    Stephen MuslimAlex Leak Jr.  ZOX:[id number] DOB: 07-29-1928 DOA: 01/27/2019 PCP: [facility]   Brief Narrative:  HPI per Dr. Candelaria Stagersaye Gonfa on 02/11/2019 Stephen MuslimAlex Leak Jr. is a 85 y.o. male with history of diabetes, dementia, hypertension and A. fib on Eliquis presenting with unwitnessed fall and altered mental status.   Patient was not able to provide history due to encephalopathy/dementia.  History based on chart review and discussion with EDP.  Minimally follows command.  Does not appear to be in distress.  Trying to get out of the bed.  Responds no to pain.  Called and talked to patient's daughter who had a call from facility saying he had unwitnessed fall.  She was told his eyes were rolling backward.  She did not have more detail.  She confirms with me that he is full code.  Per EMS, not able to do stroke screen due to encephalopathy.  Was given 500 cc normal saline bolus.  Vital signs not impressive.  CBG elevated to 378.  In ED, hemodynamically stable.  100% on room air.  Hgb 12.7.  MCV 100.  Platelet 124.  Sodium 152.  Bicarb 17.  Glucose 235.  BUN 60.  Creatinine 2.99 (no baseline to compare to).  Anion gap 16.  Otherwise CMP not impressive.  High-sensitivity troponin 36.  EKG with sinus rhythm, PVCs, TW bilaterally and QTC to 580s.  Urinalysis with glucose greater than 500.  No ketones.  Portable CXR, CT head and cervical spine without acute finding.  COVID-19 negative.  Was given normal saline bolus 1 L and hospitalist service was called for admission for encephalopathy and fall.  **Interim History  SLP done and recommending dysphagia 2 diet with thin liquids.  Creatinine wastrending down with IV fluid resuscitation and fluid has been changed to D5W and now stopped Slowly improving and more awake today.  We will continue antibiotics empirically for a suspected pneumonia  Assessment & Plan:   Active Problems:   Acute encephalopathy  Unwitnessed Fall at facility  Acute metabolic encephalopathy in the setting of Prior Dementia and ? Infection from PNA Dehydration from suspected PNA -Suspect dehydration and mild DKA based on his labs.   -Initial Neuro exam reassuring also limited due to patient's inability to cooperate but today he was somnolent but arousable to painful stimulu.  He is not on sedating medications.  CT head and cervical spine without acute finding.  No findings to suggest infectious process at this time. -Hydrate with 0.45 NS at 150 cc/hours for 24 hours and this was changed to D5W at 75 mL/hr and will continue but will reduce rate today to 50 mL/hr given continued improvement with his Cr; Will resume for 12 more hours IV lines were removed so Midline was Inserted and unfortunately midline is no longer working so we will need IV team to address -RN to swallow test and SLP evaluated and recommending Dysphagia 2 Diet with Thin Liquids and Puree Medications  -PT/OT to Evaluate and Treat and they are recommending SNF again -Patient was more awake to me this AM but not alert and still pleasantly demented -Dehydration is improving and patient is more awake and talking today  -Will place on Delirium Precautions  -Patient is slowly improving and is unclear what his actual baseline is and will need to discuss with daughter again as she states that he does have some conversations but withdraws when physicians come around  Left Sided PNA versus acute bronchitis, likely  poA but masked by Significant Dehydration -CXR on Admission showed "The heart size and mediastinal contours are within normal limits. Both lungs are clear. The visualized skeletal structures are unremarkable." -Repeat CXR AM showed "Left ventricular prominence. Aortic atherosclerosis. New development of patchy density in the mid and lower lungs bilaterally. Findings could represent developing pneumonia or possibly heart failure. Pneumonia is favored. No visible effusion. Bony structures  unremarkable." -Decreased IVF rate to 50 mL/hr x12 hours and then stop -C/w DuoNeb; Added scheduled but was changed to PRN by Respiratory -Start ABx with IV Cefepime and will hold off on starting IV Vancomycin but will check MRSA PCR -Will start Flutter Valve and Incentive Spirometry; Also add Guiafenesin 1200 mg po BID  -Check Procalcitonin Level and was <0.10 x3 -Still coughing intermittently  -Will need a Sputum Cx and check Strep and Legionella Urinary Ag -Repeat CXR this AM showed "Stable cardiomegaly. Mild peribronchial cuffing and bilateral perihilar interstitial prominence. These changes may be chronic. Active bronchitis/mild pneumonitis cannot be excluded." -Will get patient OOB to Chair -SARS-CoV-2 Testing Negative  -Continue to Treat empirically and today is Day 3 of Abx  Paroxysmal A. Fib -In sinus rhythm currently. -Rate controlled with Metoprolol Succinate 50 mg po Daily  On Eliquis for anticoagulation and dose was adjusted for Renal Fxn.   -CT head without acute finding. -Continue home meds   Hypernatremia and Hyperchloremia, improved  -Na 152 on Admission and Likely due to dehydration. -IVF now D/C'd -Na+ is slowly improving and is now 138 and Chloride is 111 -Continue to Monitor and Trend -Repeat CMP in AM   AKI with Azotemia, improving  -Creatinine 2.99 (unknown baseline) and BUN 60 on admission .   -Likely prerenal etiology.   -He is also on low-dose lisinopril and Aldactone at home and this has been held and will continue to Hold -Hydration as above; BUN/creatinine was trending down and went from 60/2.99 -> 30/1.49 but now trending back up and went  to 31/1.55 -> 32/1.67  -IVF as above at 50 mL/hr x12 hours now D/C'd -We will check a renal ultrasound in the a.m. to evaluate for medical renal disease -Hold Nephrotoxic Meds, avoid contrast dyes, and hypotension  -Continue to Monitor and Trend Renal Fxn; Repeat CMP in AM   Mild DKA/ Uncontrolled IDDM-2,  improving  -Bicarb 17 with anion gap of 16.  Glucose 236.   -Repeat CMP this AM showed CO2 was 19 and AG was 9 -On Levemir 27 units at bedtime but will change to 10 units BID -IV hydration as above -HbA1c was 9.6 -CBG's ranging from 87-306 -Started Sensitive Novolog SSI AC/HS and will continue to Monitor Blood Sugars Carefully  -Will need Diabetic Education Coordinator evaluation -Also added Novolog 3 Units TIDwm  Hypertension -Continue Home Metoprolol 50 mg po Daily  -BP this AM was 117/55  Elevated Troponin/abnormal EKG; Troponin Trending down -Likely due to dehydration and delayed clearance from AKI versus ACS.  -He has some TWI or lateral leads but no baseline EKG to compare to. -Trend high-sensitivity troponin and slightly trended up and went from 36 -> 50.0 -> 53.0 -> 42.0 -> 45.0  -Recheck EKG in the morning -Continue Metoprolol and Eliquis as above -Continue home statin -If significantly worsening will need Cardiology Evaluation   QTC Prolongation -QTC prolonged to 580 and repeat EKG this AM showed QTc of 470 -Hold home Zoloft -Closely monitor electrolytes and replenish  -Repeat EKG in AM  -Magnesium this AM was 1.8  Normocytic Anemia/Suspected  Anemia of CKD -Patient's Hgb/hHct went from 12.7/41.3 -> 10.2/30.7 -In the setting of IVF and ? Dilutional Drop -Anemia panel was checked and showed an iron level of 61, U IBC of 145, TIBC of 206, saturation ratios of 30%, ferritin level 144, folate level 30.5, vitamin B12 of 1803 -Continue to monitor for signs and symptoms of bleeding; currently no overt bleeding noted -Repeat CBC in a.m.  Thrombocytopenia -Patient's platelet count dropped from 124,000 -> 104,000 -> 87,000 -> 89,000 -> 83,000 -? Related to PNA -Continue to monitor for signs and symptoms of bleeding  -Suspected in the setting of dehydration but will need to look into other causes and likley PNA is playing a part  -Repeat CBC in a.m.  Hypokalemia  -Patient's K+ was 3.1 and improved to 4.0 -Continue to Monitor and Replete as Necessary -Repeat CMP in AM   DVT prophylaxis: Anticoagulated with Apixaban 2.5 mg po BID  Code Status: FULL CODE Family Communication: No family present at bedside but updated daughter over the phone. Disposition Plan: Remain Inpatient for continued workup and treatment and D/C back to SNF when medically stable.  Will need to discuss with daughter about his functional status and baseline again and continue to monitor his renal function carefully  Consultants:   None   Procedures: None  Antimicrobials:  Anti-infectives (From admission, onward)   Start     Dose/Rate Route Frequency Ordered Stop   02/12/19 0300  ceFEPIme (MAXIPIME) 2 g in sodium chloride 0.9 % 100 mL IVPB     2 g 200 mL/hr over 30 Minutes Intravenous Every 12 hours 02/11/19 1353     02/11/19 1330  ceFEPIme (MAXIPIME) 2 g in sodium chloride 0.9 % 100 mL IVPB     2 g 200 mL/hr over 30 Minutes Intravenous  Once 02/11/19 1310 02/11/19 1541     Subjective: Seen and examined at bedside and he was awake and somewhat responsive to me but still extremely demented and confused.  Did have conversation but will try to pull out his mittens.  Denies any pain.  No other concerns complaints at this time.  Objective: Vitals:   02/12/19 2111 02/13/19 0533 02/13/19 1418 02/13/19 1954  BP: 107/88 (!) 127/95 134/67 (!) 117/55  Pulse: 76 61 79 86  Resp: 19 19 18 20   Temp: 98.7 F (37.1 C) 97.9 F (36.6 C) 97.9 F (36.6 C) 98.8 F (37.1 C)  TempSrc: Oral Oral Axillary   SpO2: 98% 98% 98% 98%  Weight:      Height:        Intake/Output Summary (Last 24 hours) at 02/13/2019 1955 Last data filed at 02/13/2019 [id number] Gross per 24 hour  Intake 2370 ml  Output 1800 ml  Net 570 ml   Filed Weights   02/09/19 1000  Weight: 78.2 kg   Examination: Physical Exam:  Constitutional: Well-nourished, well-developed overweight elderly African-American male  currently no acute distress but is trying to get out of his soft mittens and is turned his body but he is more awake and remains pleasantly confused Eyes: Lids and conjunctive are normal.  Sclera anicteric ENMT: External ears nose appear normal.  Grossly normal hearing Neck: Appears supple no JVD Respiratory: Diminished to auscultation bilaterally with no appreciable wheezing, rales, rhonchi.  Patient not tachypneic using accessory muscle breathe Cardiovascular: Regular rate and rhythm.  No appreciable murmurs, rubs, gallops.  Has some lower extremity edema Abdomen: Soft, nontender, distended second body habitus.  Bowel sounds present GU: Deferred Musculoskeletal: No  contractures or cyanosis Skin: Skin is warm and dry.  No appreciable rashes or lesions limited skin evaluation does have some bruising in the upper extremities Neurologic: Cranial nerves II through XII grossly intact and is Chundi pull off his mittens and has a mild tremor Psychiatric: Impaired judgment and insight.  He is more awake and alert but not oriented.  Only oriented times self  Data Reviewed: I have personally reviewed following labs and imaging studies  CBC: Recent Labs  Lab July 14, 2019 1240  02/09/19 0500 02/10/19 0528 02/11/19 0723 02/12/19 0613 02/13/19 0602  WBC 8.4   < > 7.3 7.2 6.5 7.4 7.0  NEUTROABS 6.1  --   --  4.1 3.9 4.8 4.4  HGB 12.7*   < > 11.0* 11.0* 10.5* 10.6* 10.2*  HCT 41.3   < > 36.0* 33.5* 31.9* 32.2* 30.7*  MCV 100.2*   < > 99.2 94.6 93.0 94.2 94.2  PLT 124*   < > 115* 104* 87* 89* 83*   < > = values in this interval not displayed.   Basic Metabolic Panel: Recent Labs  Lab July 14, 2019 1857 02/09/19 0500 02/10/19 0528 02/11/19 0723 02/12/19 0613 02/13/19 0602  NA 151* 149* 143 139 138 138  K 4.0 3.6 3.1* 4.4 4.3 4.0  CL 118* 119* 112* 111 109 111  CO2 23 20* 24 19* 20* 19*  GLUCOSE 256* 170* 136* 203* 239* 125*  BUN 63* 57* 43* 30* 31* 32*  CREATININE 2.78*  2.75* 2.21* 1.66* 1.49*  1.55* 1.67*  CALCIUM 8.9 8.7* 8.7* 8.6* 8.5* 8.4*  MG 2.1  --  2.0 1.8 1.8 1.8  PHOS  --   --  2.8 2.8 2.7 2.8   GFR: Estimated Creatinine Clearance: 30.1 mL/min (A) (by C-G formula based on SCr of 1.67 mg/dL (H)). Liver Function Tests: Recent Labs  Lab July 14, 2019 1240 02/10/19 0528 02/11/19 0723 02/12/19 0613 02/13/19 0602  AST 37 37 39 32 30  ALT 15 14 13 16 15   ALKPHOS 65 57 60 60 53  BILITOT 0.4 0.5 0.6 0.4 0.6  PROT 6.6 6.0* 5.8* 5.7* 5.7*  ALBUMIN 3.9 3.7 3.3* 3.2* 3.3*   No results for input(s): LIPASE, AMYLASE in the last 168 hours. No results for input(s): AMMONIA in the last 168 hours. Coagulation Profile: No results for input(s): INR, PROTIME in the last 168 hours. Cardiac Enzymes: Recent Labs  Lab July 14, 2019 1857  CKTOTAL 607*   BNP (last 3 results) No results for input(s): PROBNP in the last 8760 hours. HbA1C: No results for input(s): HGBA1C in the last 72 hours. CBG: Recent Labs  Lab 02/12/19 1541 02/12/19 2114 02/13/19 0749 02/13/19 1137 02/13/19 1644  GLUCAP 208* 87 128* 154* 169*   Lipid Profile: No results for input(s): CHOL, HDL, LDLCALC, TRIG, CHOLHDL, LDLDIRECT in the last 72 hours. Thyroid Function Tests: No results for input(s): TSH, T4TOTAL, FREET4, T3FREE, THYROIDAB in the last 72 hours. Anemia Panel: No results for input(s): VITAMINB12, FOLATE, FERRITIN, TIBC, IRON, RETICCTPCT in the last 72 hours. Sepsis Labs: Recent Labs  Lab 02/11/19 1418 02/12/19 0613 02/13/19 0602  PROCALCITON <0.10 <0.10 <0.10    Recent Results (from the past 240 hour(s))  Urine culture     Status: None   Collection Time: July 14, 2019 12:29 PM   Specimen: Urine, Random  Result Value Ref Range Status   Specimen Description   Final    URINE, RANDOM Performed at [facility], [address] [place], [place] [id number]  Special Requests   Final    NONE Performed at [facility], [address] [place], [place]  [address]    Culture   Final    NO GROWTH Performed at [facility], [address] [place], [place] [address]    Report Status 02/09/2019 FINAL  Final  SARS Coronavirus 2 (CEPHEID- Performed in [facility] hospital lab), Hosp Order     Status: None   Collection Time: February 21, 2019  1:14 PM   Specimen: Nasopharyngeal Swab  Result Value Ref Range Status   SARS Coronavirus 2 NEGATIVE NEGATIVE Final    Comment: (NOTE) If result is NEGATIVE SARS-CoV-2 target nucleic acids are NOT DETECTED. The SARS-CoV-2 RNA is generally detectable in upper and lower  respiratory specimens during the acute phase of infection. The lowest  concentration of SARS-CoV-2 viral copies this assay can detect is 250  copies / mL. A negative result does not preclude SARS-CoV-2 infection  and should not be used as the sole basis for treatment or other  patient management decisions.  A negative result may occur with  improper specimen collection / handling, submission of specimen other  than nasopharyngeal swab, presence of viral mutation(s) within the  areas targeted by this assay, and inadequate number of viral copies  (<250 copies / mL). A negative result must be combined with clinical  observations, patient history, and epidemiological information. If result is POSITIVE SARS-CoV-2 target nucleic acids are DETECTED. The SARS-CoV-2 RNA is generally detectable in upper and lower  respiratory specimens dur ing the acute phase of infection.  Positive  results are indicative of active infection with SARS-CoV-2.  Clinical  correlation with patient history and other diagnostic information is  necessary to determine patient infection status.  Positive results do  not rule out bacterial infection or co-infection with other viruses. If result is PRESUMPTIVE POSTIVE SARS-CoV-2 nucleic acids MAY BE PRESENT.   A presumptive positive result was obtained on the submitted specimen  and confirmed on repeat testing.  While 2019  novel coronavirus  (SARS-CoV-2) nucleic acids may be present in the submitted sample  additional confirmatory testing may be necessary for epidemiological  and / or clinical management purposes  to differentiate between  SARS-CoV-2 and other Sarbecovirus currently known to infect humans.  If clinically indicated additional testing with an alternate test  methodology [id number]) is advised. The SARS-CoV-2 RNA is generally  detectable in upper and lower respiratory sp ecimens during the acute  phase of infection. The expected result is Negative. Fact Sheet for Patients:  BoilerBrush.com.cy Fact Sheet for Healthcare Providers: https://pope.com/ This test is not yet approved or cleared by the [place] FDA and has been authorized for detection and/or diagnosis of SARS-CoV-2 by FDA under an Emergency Use Authorization (EUA).  This EUA will remain in effect (meaning this test can be used) for the duration of the COVID-19 declaration under Section 564(b)(1) of the Act, 21 U.S.C. section 360bbb-3(b)(1), unless the authorization is terminated or revoked sooner. Performed at [facility], [address] [place], [place] [address]   MRSA PCR Screening     Status: None   Collection Time: 02/11/19  1:03 PM   Specimen: Nasopharyngeal  Result Value Ref Range Status   MRSA by PCR NEGATIVE NEGATIVE Final    Comment:        The GeneXpert MRSA Assay (FDA approved for NASAL specimens only), is one component of a comprehensive MRSA colonization surveillance program. It is not intended to  diagnose MRSA infection nor to guide or monitor treatment for MRSA infections. Performed at [facility], [place] [address] [place], [place] [address]     Radiology Studies: Dg Chest Port 1 View  Result Date: 02/13/2019 CLINICAL DATA:  Shortness of breath. EXAM: PORTABLE CHEST 1 VIEW COMPARISON:  02/12/2019. FINDINGS: Stable  cardiomegaly. No pulmonary venous congestion. Mild peribronchial cuffing and bilateral interstitial prominence. These changes may be chronic. Active bronchitis/mild pneumonitis cannot be excluded. Mild bibasilar atelectasis and or scarring. No pleural effusion or pneumothorax. IMPRESSION: 1.  Stable cardiomegaly. 2. Mild peribronchial cuffing and bilateral perihilar interstitial prominence. These changes may be chronic. Active bronchitis/mild pneumonitis cannot be excluded. Electronically Signed   By: Veyo   On: 02/13/2019 06:30   Dg Chest Port 1 View  Result Date: 02/12/2019 CLINICAL DATA:  Shortness of breath. EXAM: PORTABLE CHEST 1 VIEW COMPARISON:  Chest x-rays dated 02/11/2019 and 2019-03-01. FINDINGS: Study is hypoinspiratory with crowding of the perihilar bronchovascular markings. Given the low lung volumes, lungs appear clear. No pleural effusion or pneumothorax seen. Heart size and mediastinal contours are stable. Osseous structures about the chest are unremarkable. IMPRESSION: Low lung volumes. No active disease. No evidence of pneumonia or pulmonary edema. Electronically Signed   By: Franki Cabot M.D.   On: 02/12/2019 04:25   Scheduled Meds: . apixaban  2.5 mg Oral BID  . guaiFENesin  1,200 mg Oral BID  . haloperidol lactate  1 mg Intravenous Once  . insulin aspart  0-5 Units Subcutaneous QHS  . insulin aspart  0-9 Units Subcutaneous TID WC  . insulin aspart  10 Units Subcutaneous Once  . insulin aspart  3 Units Subcutaneous TID WC  . insulin detemir  10 Units Subcutaneous BID  . magnesium oxide  400 mg Oral QODAY  . metoprolol succinate  50 mg Oral Daily  . rosuvastatin  10 mg Oral QHS  . senna  1 tablet Oral BID  . sodium chloride flush  10-40 mL Intracatheter Q12H  . vitamin B-12  1,000 mcg Oral Daily   Continuous Infusions: . ceFEPime (MAXIPIME) IV 2 g (02/13/19 1432)    LOS: 4 days   Kerney Elbe, DO [facility] PAGER is on [place]  If 7PM-7AM,  please contact night-coverage [email or web address] Password [facility] 02/13/2019, 7:55 PM

## 2019-02-13 NOTE — Progress Notes (Signed)
Physical Therapy Treatment Patient Details Name: Stephen Muslimlex Leak Jr. MRN: [id number] DOB: 1929-06-28 Today's Date: 02/13/2019    History of Present Illness Pt admitted with AMS 2* metabolic encephalopathy.  Pt wtih hx of dementia and a-fib    PT Comments    Pt progressing slowly, requires intermittent assist of 2. Recommend SNF, would 24hr assist/supervision at his current status.     Follow Up Recommendations  SNF     Equipment Recommendations  None recommended by PT    Recommendations for Other Services       Precautions / Restrictions Precautions Precautions: Fall Restrictions Weight Bearing Restrictions: No    Mobility  Bed Mobility Overal bed mobility: Needs Assistance Bed Mobility: Sit to Supine;Supine to Sit     Supine to sit: Min assist Sit to supine: Mod assist   General bed mobility comments: assist with trunk to upright, assist with LEs to supine, incr time, mutli-modal cues   Transfers Overall transfer level: Needs assistance Equipment used: Rolling walker (2 wheeled) Transfers: Sit to/from [employer]Stand;Stand Pivot Transfers Sit to Stand: Min assist Stand pivot transfers: Mod assist;+2 physical assistance;+2 safety/equipment       General transfer comment: assist to bring wt up and fwd and to balance in standing with RW. requires +2 to rise from chair and pivto to bed  Ambulation/Gait Ambulation/Gait assistance: Min assist;Mod assist;+2 physical assistance;+2 safety/equipment Gait Distance (Feet): 35 Feet Assistive device: Rolling walker (2 wheeled) Gait Pattern/deviations: Step-through pattern;Decreased stride length;Narrow base of support;Scissoring Gait velocity: decr   General Gait Details: scissoring gait noted with R lean.  Cues for posture, position from RW and increased BOS - limited correction noted.  Physical assist for balance, support and RW management   Stairs             Wheelchair Mobility    Modified Rankin (Stroke Patients  Only)       Balance                                            Cognition Arousal/Alertness: (sleepy, arouses to voice fairly easily) Behavior During Therapy: Flat affect;Impulsive Overall Cognitive Status: History of cognitive impairments - at baseline                                 General Comments: grossly nonverbal. follows one step commands inconsistently, even with multi-modal cues       Exercises      General Comments        Pertinent Vitals/Pain Pain Assessment: Faces Faces Pain Scale: No hurt Pain Intervention(s): Monitored during session    Home Living                      Prior Function            PT Goals (current goals can now be found in the care plan section) Acute Rehab PT Goals Patient Stated Goal: No goals stated PT Goal Formulation: Patient unable to participate in goal setting Time For Goal Achievement: 06-01-2019 Potential to Achieve Goals: Fair Progress towards PT goals: Progressing toward goals(slowly,limited by cognition)    Frequency    Min 2X/week      PT Plan Frequency needs to be updated;Current plan remains appropriate    Co-evaluation  AM-PAC PT "6 Clicks" Mobility   Outcome Measure  Help needed turning from your back to your side while in a flat bed without using bedrails?: None Help needed moving from lying on your back to sitting on the side of a flat bed without using bedrails?: A Lot Help needed moving to and from a bed to a chair (including a wheelchair)?: A Lot Help needed standing up from a chair using your arms (e.g., wheelchair or bedside chair)?: A Lot Help needed to walk in hospital room?: A Lot Help needed climbing 3-5 steps with a railing? : Total 6 Click Score: 13    End of Session Equipment Utilized During Treatment: Gait belt Activity Tolerance: Patient limited by fatigue;Other (comment)(cognition) Patient left: in bed;with call bell/phone within  reach;with restraints reapplied;with bed alarm set(mittens) Nurse Communication: Mobility status PT Visit Diagnosis: Difficulty in walking, not elsewhere classified (R26.2);Muscle weakness (generalized) (M62.81)     Time: 1610-9604 PT Time Calculation (min) (ACUTE ONLY): 27 min  Charges:  $Gait Training: 23-37 mins                     Kenyon Ana, PT  Pager: [phone] Acute Rehab Dept [facility]): [phone]   02/13/2019    [facility] 02/13/2019, 2:00 PM

## 2019-02-13 NOTE — Care Management Important Message (Signed)
Important Message  Patient Details IM Letter given to Velva Harman RN to present to the Patient  Name: Stephen Hester. MRN: [id number] Date of Birth: 10/11/1928   Medicare Important Message Given:  Yes     Kerin Salen 02/13/2019, 10:44 AM

## 2019-02-14 LAB — COMPREHENSIVE METABOLIC PANEL
ALT: 15 U/L (ref 0–44)
AST: 31 U/L (ref 15–41)
Albumin: 3.1 g/dL — ABNORMAL LOW (ref 3.5–5.0)
Alkaline Phosphatase: 55 U/L (ref 38–126)
Anion gap: 8 (ref 5–15)
BUN: 35 mg/dL — ABNORMAL HIGH (ref 8–23)
CO2: 19 mmol/L — ABNORMAL LOW (ref 22–32)
Calcium: 8.4 mg/dL — ABNORMAL LOW (ref 8.9–10.3)
Chloride: 108 mmol/L (ref 98–111)
Creatinine, Ser: 1.69 mg/dL — ABNORMAL HIGH (ref 0.61–1.24)
GFR calc Af Amer: 41 mL/min — ABNORMAL LOW (ref >60)
GFR calc non Af Amer: 36 mL/min — ABNORMAL LOW (ref >60)
Glucose, Bld: 167 mg/dL — ABNORMAL HIGH (ref 70–99)
Potassium: 4.1 mmol/L (ref 3.5–5.1)
Sodium: 135 mmol/L (ref 135–145)
Total Bilirubin: 0.6 mg/dL (ref 0.3–1.2)
Total Protein: 5.9 g/dL — ABNORMAL LOW (ref 6.5–8.1)

## 2019-02-14 LAB — CBC WITH DIFFERENTIAL/PLATELET
Abs Immature Granulocytes: 0.08 10*3/uL — ABNORMAL HIGH (ref 0.00–0.07)
Basophils Absolute: 0 10*3/uL (ref 0.0–0.1)
Basophils Relative: 0 %
Eosinophils Absolute: 0.3 10*3/uL (ref 0.0–0.5)
Eosinophils Relative: 3 %
HCT: 29.9 % — ABNORMAL LOW (ref 39.0–52.0)
Hemoglobin: 10 g/dL — ABNORMAL LOW (ref 13.0–17.0)
Immature Granulocytes: 1 %
Lymphocytes Relative: 19 %
Lymphs Abs: 1.5 10*3/uL (ref 0.7–4.0)
MCH: 30.9 pg (ref 26.0–34.0)
MCHC: 33.4 g/dL (ref 30.0–36.0)
MCV: 92.3 fL (ref 80.0–100.0)
Monocytes Absolute: 0.9 10*3/uL (ref 0.1–1.0)
Monocytes Relative: 12 %
Neutro Abs: 4.9 10*3/uL (ref 1.7–7.7)
Neutrophils Relative %: 65 %
Platelets: 102 10*3/uL — ABNORMAL LOW (ref 150–400)
RBC: 3.24 MIL/uL — ABNORMAL LOW (ref 4.22–5.81)
RDW: 12.9 % (ref 11.5–15.5)
WBC: 7.6 10*3/uL (ref 4.0–10.5)
nRBC: 0 % (ref 0.0–0.2)

## 2019-02-14 LAB — GLUCOSE, CAPILLARY
Glucose-Capillary: 177 mg/dL — ABNORMAL HIGH (ref 70–99)
Glucose-Capillary: 187 mg/dL — ABNORMAL HIGH (ref 70–99)
Glucose-Capillary: 196 mg/dL — ABNORMAL HIGH (ref 70–99)
Glucose-Capillary: 189 mg/dL — ABNORMAL HIGH (ref 70–99)

## 2019-02-14 LAB — STREP PNEUMONIAE URINARY ANTIGEN: Strep Pneumo Urinary Antigen: NEGATIVE

## 2019-02-14 LAB — MAGNESIUM: Magnesium: 1.7 mg/dL (ref 1.7–2.4)

## 2019-02-14 LAB — PHOSPHORUS: Phosphorus: 2.9 mg/dL (ref 2.5–4.6)

## 2019-02-14 NOTE — Progress Notes (Signed)
PROGRESS NOTE    Stephen Hester.  JOI:[id number] DOB: 1929/01/27 DOA: 02/01/2019 PCP: [facility]   Brief Narrative:  HPI per Dr. Wendee Beavers on 01/26/2019 Stephen Hester. is a 85 y.o. male with history of diabetes, dementia, hypertension and A. fib on Eliquis presenting with unwitnessed fall and altered mental status.   Patient was not able to provide history due to encephalopathy/dementia.  History based on chart review and discussion with EDP.  Minimally follows command.  Does not appear to be in distress.  Trying to get out of the bed.  Responds no to pain.  Called and talked to patient's daughter who had a call from facility saying he had unwitnessed fall.  She was told his eyes were rolling backward.  She did not have more detail.  She confirms with me that he is full code.  Per EMS, not able to do stroke screen due to encephalopathy.  Was given 500 cc normal saline bolus.  Vital signs not impressive.  CBG elevated to 378.  In ED, hemodynamically stable.  100% on room air.  Hgb 12.7.  MCV 100.  Platelet 124.  Sodium 152.  Bicarb 17.  Glucose 235.  BUN 60.  Creatinine 2.99 (no baseline to compare to).  Anion gap 16.  Otherwise CMP not impressive.  High-sensitivity troponin 36.  EKG with sinus rhythm, PVCs, TW bilaterally and QTC to 580s.  Urinalysis with glucose greater than 500.  No ketones.  Portable CXR, CT head and cervical spine without acute finding.  COVID-19 negative.  Was given normal saline bolus 1 L and hospitalist service was called for admission for encephalopathy and fall.  **Interim History  SLP done and recommending dysphagia 2 diet with thin liquids.  Creatinine was trending down with IV fluid resuscitation and fluid has been changed to D5W and now stopped. Cr has stabilized. Slowly improving but not as awake.  We will continue antibiotics empirically for a suspected pneumonia Day 4.   Assessment & Plan:   Active Problems:   Acute  encephalopathy  Unwitnessed Fall at facility Acute metabolic encephalopathy in the setting of Prior Dementia and ? Infection from PNA Dehydration from suspected PNA -Suspect dehydration and mild DKA based on his labs.   -Initial Neuro exam reassuring also limited due to patient's inability to cooperate but today he was somnolent but arousable to painful stimulu.  He is not on sedating medications.  CT head and cervical spine without acute finding.  No findings to suggest infectious process at this time. -Hydrate with 0.45 NS at 150 cc/hours for 24 hours and this was changed to D5W at 75 mL/hr and will continue but will reduce rate today to 50 mL/hr given continued improvement with his Cr; Will resume for 12 more hours IV lines were removed so Midline was Inserted and unfortunately midline is no longer working so we will need IV team to address -RN to swallow test and SLP evaluated and recommending Dysphagia 2 Diet with Thin Liquids and Puree Medications  -PT/OT to Evaluate and Treat and they are recommending SNF again -Patient was more awake to me this AM but not alert and still pleasantly demented -Dehydration is improving and patient is more awake and talking today  -Will place on Delirium Precautions  -Patient is slowly improving and is unclear what his actual baseline is and will need to discuss with daughter again as she states that he does have some conversations but withdraws when physicians come around  Left Sided PNA versus Acute Bronchitis, likely poA but masked by Significant Dehydration -CXR on Admission showed "The heart size and mediastinal contours are within normal limits. Both lungs are clear. The visualized skeletal structures are unremarkable." -Repeat CXR AM showed "Left ventricular prominence. Aortic atherosclerosis. New development of patchy density in the mid and lower lungs bilaterally. Findings could represent developing pneumonia or possibly heart failure. Pneumonia is  favored. No visible effusion. Bony structures unremarkable." -Decreased IVF rate to 50 mL/hr x12 hours and then stop -C/w DuoNeb; Added scheduled but was changed to PRN by Respiratory -Start ABx with IV Cefepime and will hold off on starting IV Vancomycin but will check MRSA PCR -Will start Flutter Valve and Incentive Spirometry; Also add Guiafenesin 1200 mg po BID  -Check Procalcitonin Level and was <0.10 x3 -Still coughing intermittently  -Will need a Sputum Cx and check Strep and Legionella Urinary Ag -Repeat CXR this AM showed "Stable cardiomegaly. Mild peribronchial cuffing and bilateral perihilar interstitial prominence. These changes may be chronic. Active bronchitis/mild pneumonitis cannot be excluded." -Will get patient OOB to Chair -SARS-CoV-2 Testing Negative  -Continue to Treat empirically and today is Day 4 of Abx and will continue at least for 5 Days   Paroxysmal A. Fib -In sinus rhythm currently. -Rate controlled with Metoprolol Succinate 50 mg po Daily  On Eliquis for anticoagulation and dose was adjusted for Renal Fxn.   -CT head without acute finding. -Continue home meds   Hypernatremia and Hyperchloremia, improved  -Na 152 on Admission and Likely due to dehydration. -IVF now D/C'd -Na+ is slowly improving and is now 135 and Chloride is 108 -Continue to Monitor and Trend -Repeat CMP in AM   AKI with Azotemia, improving  Metabolic Acidosis  -Creatinine 2.99 (unknown baseline) and BUN 60 on admission .   -Likely prerenal etiology.   -He is also on low-dose lisinopril and Aldactone at home and this has been held and will continue to Hold -Hydration as above; BUN/creatinine was trending down and went from 60/2.99 -> 30/1.49 but now trending back up and went  to 31/1.55 -> 32/1.67 -> 35/1.69 -IVF now D/C'd  -We will check a renal ultrasound in the a.m. to evaluate for medical renal disease; Showed "Tiny right renal cyst. No obstructive changes are noted." -Hold  Nephrotoxic Meds, avoid contrast dyes, and hypotension  -Continue to Monitor and Trend Renal Fxn; Repeat CMP in AM   Mild DKA/ Uncontrolled IDDM-2, improving  -Bicarb 17 with anion gap of 16.  Glucose 236.   -Repeat CMP this AM showed CO2 was 19 and AG was 9 -On Levemir 27 units at bedtime but will change to 10 units BID -IV hydration as above -HbA1c was 9.6 -CBG's ranging from 177-196 -Started Sensitive Novolog SSI AC/HS and will continue to Monitor Blood Sugars Carefully  -Will need Diabetic Education Coordinator evaluation -Also added Novolog 3 Units TIDwm  Hypertension -Continue Home Metoprolol 50 mg po Daily  -BP this AM was 146/69  Elevated Troponin/abnormal EKG; Troponin Trending down -Likely due to dehydration and delayed clearance from AKI versus ACS.  -He has some TWI or lateral leads but no baseline EKG to compare to. -Trend high-sensitivity troponin and slightly trended up and went from 36 -> 50.0 -> 53.0 -> 42.0 -> 45.0  -Recheck EKG in the morning -Continue Metoprolol and Eliquis as above -Continue home statin -If significantly worsening will need Cardiology Evaluation   QTC Prolongation -QTC prolonged to 580 and repeat EKG this AM showed QTc  of 470 -Hold home Zoloft -Closely monitor electrolytes and replenish  -Repeat EKG in AM  -Magnesium this AM was 1.8  Normocytic Anemia/Suspected Anemia of CKD -Patient's Hgb/hHct went from 12.7/41.3 -> 10.2/30.7 -> 10.0/29.9 -In the setting of IVF and ? Dilutional Drop -Anemia panel was checked and showed an iron level of 61, U IBC of 145, TIBC of 206, saturation ratios of 30%, ferritin level 144, folate level 30.5, vitamin B12 of 1803 -Continue to monitor for signs and symptoms of bleeding; currently no overt bleeding noted -Repeat CBC in a.m.  Thrombocytopenia -Patient's platelet count dropped from 124,000 -> 104,000 -> 87,000 -> 89,000 -> 83,000 -> 102,000 -? Related to PNA -Continue to monitor for signs and  symptoms of bleeding  -Suspected in the setting of dehydration but will need to look into other causes and likley PNA is playing a part  -Repeat CBC in a.m.  Hypokalemia -Patient's K+ was 3.1 and improved to 4.1 -Continue to Monitor and Replete as Necessary -Repeat CMP in AM   DVT prophylaxis: Anticoagulated with Apixaban 2.5 mg po BID  Code Status: FULL CODE Family Communication: No family present at bedside but updated daughter over the phone. Disposition Plan: Remain Inpatient for continued workup and treatment and D/C back to SNF when medically stable.  Will need to discuss with daughter about his functional status and baseline again and continue to monitor his renal function carefully  Consultants:   None   Procedures: None  Antimicrobials:  Anti-infectives (From admission, onward)   Start     Dose/Rate Route Frequency Ordered Stop   02/12/19 0300  ceFEPIme (MAXIPIME) 2 g in sodium chloride 0.9 % 100 mL IVPB     2 g 200 mL/hr over 30 Minutes Intravenous Every 12 hours 02/11/19 1353     02/11/19 1330  ceFEPIme (MAXIPIME) 2 g in sodium chloride 0.9 % 100 mL IVPB     2 g 200 mL/hr over 30 Minutes Intravenous  Once 02/11/19 1310 02/11/19 1541     Subjective: Seen and examined at bedside again not awake or alert.  Nursing states that he was awake earlier but not complaining of anything.  No nausea or vomiting.  Still remains in soft mitten restraints.  No other concerns or plans at this time.  Objective: Vitals:   02/13/19 1418 02/13/19 1954 02/14/19 0603 02/14/19 1257  BP: 134/67 (!) 117/55 123/89 (!) 146/69  Pulse: 79 86 67 68  Resp: 18 20 18 18   Temp: 97.9 F (36.6 C) 98.8 F (37.1 C) 98.3 F (36.8 C) 99.3 F (37.4 C)  TempSrc: Axillary  Oral Oral  SpO2: 98% 98% 98% 95%  Weight:      Height:        Intake/Output Summary (Last 24 hours) at 02/14/2019 1922 Last data filed at 02/14/2019 1500 Gross per 24 hour  Intake 300 ml  Output 1828 ml  Net -1528 ml    Filed Weights   02/09/19 1000  Weight: 78.2 kg   Examination: Physical Exam:  Constitutional: Well-nourished, well-developed overweight elderly African-American male currently who is somnolent and in no acute distress and appears very calm and still sleeping Eyes: Lids extract are normal.  Sclera anicteric ENMT: External ears nose appear normal.  Grossly normal hearing Neck: Appears supple no JVD Respiratory: Diminished auscultation bilaterally with no appreciable wheezing, rales, rhonchi.  Patient not tachypneic or using accessory muscles to breathe Cardiovascular: Regular rate and rhythm.  No appreciable murmurs, rubs, gallops.  Has trace extremity edema  Abdomen: Soft, non-trichomonacide second body habitus.  Backslash present GU: Deferred Musculoskeletal: No contractures or cyanosis noted. Skin: Skin is warm and dry no appreciable rashes or lesions on limited skin evaluation Neurologic: Does not follow commands today due to his somnolence moves extremities independently and has a mild tremor Psychiatric: Impaired judgment and insight.  Patient remains somnolent and is asleep when I walked in.  Data Reviewed: I have personally reviewed following labs and imaging studies  CBC: Recent Labs  Lab 02/10/19 0528 02/11/19 0723 02/12/19 0613 02/13/19 0602 02/14/19 0627  WBC 7.2 6.5 7.4 7.0 7.6  NEUTROABS 4.1 3.9 4.8 4.4 4.9  HGB 11.0* 10.5* 10.6* 10.2* 10.0*  HCT 33.5* 31.9* 32.2* 30.7* 29.9*  MCV 94.6 93.0 94.2 94.2 92.3  PLT 104* 87* 89* 83* 102*   Basic Metabolic Panel: Recent Labs  Lab 02/10/19 0528 02/11/19 0723 02/12/19 0613 02/13/19 0602 02/14/19 0627  NA 143 139 138 138 135  K 3.1* 4.4 4.3 4.0 4.1  CL 112* 111 109 111 108  CO2 24 19* 20* 19* 19*  GLUCOSE 136* 203* 239* 125* 167*  BUN 43* 30* 31* 32* 35*  CREATININE 1.66* 1.49* 1.55* 1.67* 1.69*  CALCIUM 8.7* 8.6* 8.5* 8.4* 8.4*  MG 2.0 1.8 1.8 1.8 1.7  PHOS 2.8 2.8 2.7 2.8 2.9   GFR: Estimated Creatinine  Clearance: 29.8 mL/min (A) (by C-G formula based on SCr of 1.69 mg/dL (H)). Liver Function Tests: Recent Labs  Lab 02/10/19 0528 02/11/19 0723 02/12/19 0613 02/13/19 0602 02/14/19 0627  AST 37 39 32 30 31  ALT 14 13 16 15 15   ALKPHOS 57 60 60 53 55  BILITOT 0.5 0.6 0.4 0.6 0.6  PROT 6.0* 5.8* 5.7* 5.7* 5.9*  ALBUMIN 3.7 3.3* 3.2* 3.3* 3.1*   No results for input(s): LIPASE, AMYLASE in the last 168 hours. No results for input(s): AMMONIA in the last 168 hours. Coagulation Profile: No results for input(s): INR, PROTIME in the last 168 hours. Cardiac Enzymes: Recent Labs  Lab 02/13/2019 1857  CKTOTAL 607*   BNP (last 3 results) No results for input(s): PROBNP in the last 8760 hours. HbA1C: No results for input(s): HGBA1C in the last 72 hours. CBG: Recent Labs  Lab 02/13/19 1644 02/13/19 1955 02/14/19 0714 02/14/19 1104 02/14/19 1600  GLUCAP 169* 306* 177* 187* 196*   Lipid Profile: No results for input(s): CHOL, HDL, LDLCALC, TRIG, CHOLHDL, LDLDIRECT in the last 72 hours. Thyroid Function Tests: No results for input(s): TSH, T4TOTAL, FREET4, T3FREE, THYROIDAB in the last 72 hours. Anemia Panel: No results for input(s): VITAMINB12, FOLATE, FERRITIN, TIBC, IRON, RETICCTPCT in the last 72 hours. Sepsis Labs: Recent Labs  Lab 02/11/19 1418 02/12/19 0613 02/13/19 0602  PROCALCITON <0.10 <0.10 <0.10    Recent Results (from the past 240 hour(s))  Urine culture     Status: None   Collection Time: 02/11/2019 12:29 PM   Specimen: Urine, Random  Result Value Ref Range Status   Specimen Description   Final    URINE, RANDOM Performed at [facility], [address] [place], [place] [address]    Special Requests   Final    NONE Performed at [facility], [address] [place], [place] [address]    Culture   Final    NO GROWTH Performed at [facility], [address] [place], [place] [address]    Report Status  02/09/2019 FINAL  Final  SARS Coronavirus 2 (CEPHEID- Performed in [facility] hospital lab), Hosp Order     Status: None   Collection Time: 02/18/2019  1:14 PM   Specimen: Nasopharyngeal Swab  Result Value Ref Range Status   SARS Coronavirus 2 NEGATIVE NEGATIVE Final    Comment: (NOTE) If result is NEGATIVE SARS-CoV-2 target nucleic acids are NOT DETECTED. The SARS-CoV-2 RNA is generally detectable in upper and lower  respiratory specimens during the acute phase of infection. The lowest  concentration of SARS-CoV-2 viral copies this assay can detect is 250  copies / mL. A negative result does not preclude SARS-CoV-2 infection  and should not be used as the sole basis for treatment or other  patient management decisions.  A negative result may occur with  improper specimen collection / handling, submission of specimen other  than nasopharyngeal swab, presence of viral mutation(s) within the  areas targeted by this assay, and inadequate number of viral copies  (<250 copies / mL). A negative result must be combined with clinical  observations, patient history, and epidemiological information. If result is POSITIVE SARS-CoV-2 target nucleic acids are DETECTED. The SARS-CoV-2 RNA is generally detectable in upper and lower  respiratory specimens dur ing the acute phase of infection.  Positive  results are indicative of active infection with SARS-CoV-2.  Clinical  correlation with patient history and other diagnostic information is  necessary to determine patient infection status.  Positive results do  not rule out bacterial infection or co-infection with other viruses. If result is PRESUMPTIVE POSTIVE SARS-CoV-2 nucleic acids MAY BE PRESENT.   A presumptive positive result was obtained on the submitted specimen  and confirmed on repeat testing.  While 2019 novel coronavirus  (SARS-CoV-2) nucleic acids may be present in the submitted sample  additional confirmatory testing may be necessary  for epidemiological  and / or clinical management purposes  to differentiate between  SARS-CoV-2 and other Sarbecovirus currently known to infect humans.  If clinically indicated additional testing with an alternate test  methodology [phone]) is advised. The SARS-CoV-2 RNA is generally  detectable in upper and lower respiratory sp ecimens during the acute  phase of infection. The expected result is Negative. Fact Sheet for Patients:  BoilerBrush.com.cy Fact Sheet for Healthcare Providers: https://pope.com/ This test is not yet approved or cleared by the [place] FDA and has been authorized for detection and/or diagnosis of SARS-CoV-2 by FDA under an Emergency Use Authorization (EUA).  This EUA will remain in effect (meaning this test can be used) for the duration of the COVID-19 declaration under Section 564(b)(1) of the Act, 21 U.S.C. section 360bbb-3(b)(1), unless the authorization is terminated or revoked sooner. Performed at [facility], [address] [place], [place] [address]   MRSA PCR Screening     Status: None   Collection Time: 02/11/19  1:03 PM   Specimen: Nasopharyngeal  Result Value Ref Range Status   MRSA by PCR NEGATIVE NEGATIVE Final    Comment:        The GeneXpert MRSA Assay (FDA approved for NASAL specimens only), is one component of a comprehensive MRSA colonization surveillance program. It is not intended to diagnose MRSA infection nor to guide or monitor treatment for MRSA infections. Performed at [facility], [address], [place], [place] [address]     Radiology Studies: US Renal  Result Date: 02/13/2019 CLINICAL DATA:  Acute renal injury EXAM: RENAL / URINARY TRACT ULTRASOUND COMPLETE COMPARISON:  None. FINDINGS: Right Kidney: Renal measurements: 10.3 x 5.3 x 5.5 cm. =  volume: 157 mL. 8 mm subcortical cyst is noted. No obstructive changes are seen Left  Kidney: Renal measurements: 10.3 x 4.8 x 4.9 cm. = volume: 128 mL. Echogenicity within normal limits. No mass or hydronephrosis visualized. Bladder: Appears normal for degree of bladder distention. IMPRESSION: Tiny right renal cyst. No obstructive changes are noted. Electronically Signed   By: Alcide Clever M.D.   On: 02/13/2019 20:50   Dg Chest Port 1 View  Result Date: 02/13/2019 CLINICAL DATA:  Shortness of breath. EXAM: PORTABLE CHEST 1 VIEW COMPARISON:  02/12/2019. FINDINGS: Stable cardiomegaly. No pulmonary venous congestion. Mild peribronchial cuffing and bilateral interstitial prominence. These changes may be chronic. Active bronchitis/mild pneumonitis cannot be excluded. Mild bibasilar atelectasis and or scarring. No pleural effusion or pneumothorax. IMPRESSION: 1.  Stable cardiomegaly. 2. Mild peribronchial cuffing and bilateral perihilar interstitial prominence. These changes may be chronic. Active bronchitis/mild pneumonitis cannot be excluded. Electronically Signed   By: Maisie Fus  Register   On: 02/13/2019 06:30   Scheduled Meds:  apixaban  2.5 mg Oral BID   guaiFENesin  1,200 mg Oral BID   haloperidol lactate  1 mg Intravenous Once   insulin aspart  0-5 Units Subcutaneous QHS   insulin aspart  0-9 Units Subcutaneous TID WC   insulin aspart  10 Units Subcutaneous Once   insulin aspart  3 Units Subcutaneous TID WC   insulin detemir  10 Units Subcutaneous BID   magnesium oxide  400 mg Oral QODAY   metoprolol succinate  50 mg Oral Daily   rosuvastatin  10 mg Oral QHS   senna  1 tablet Oral BID   sodium chloride flush  10-40 mL Intracatheter Q12H   vitamin B-12  1,000 mcg Oral Daily   Continuous Infusions:  ceFEPime (MAXIPIME) IV 2 g (02/14/19 1444)    LOS: 5 days   Merlene Laughter, DO [facility] PAGER is on AMION  If 7PM-7AM, please contact night-coverage [email or web address] Password [facility] 02/14/2019, 7:22 PM

## 2019-02-15 DIAGNOSIS — E876 Hypokalemia: Secondary | ICD-10-CM | POA: Diagnosis present

## 2019-02-15 DIAGNOSIS — E870 Hyperosmolality and hypernatremia: Secondary | ICD-10-CM | POA: Diagnosis present

## 2019-02-15 DIAGNOSIS — R9431 Abnormal electrocardiogram [ECG] [EKG]: Secondary | ICD-10-CM

## 2019-02-15 DIAGNOSIS — I480 Paroxysmal atrial fibrillation: Secondary | ICD-10-CM | POA: Diagnosis present

## 2019-02-15 DIAGNOSIS — R7989 Other specified abnormal findings of blood chemistry: Secondary | ICD-10-CM

## 2019-02-15 DIAGNOSIS — R778 Other specified abnormalities of plasma proteins: Secondary | ICD-10-CM | POA: Diagnosis present

## 2019-02-15 DIAGNOSIS — N179 Acute kidney failure, unspecified: Secondary | ICD-10-CM | POA: Diagnosis present

## 2019-02-15 DIAGNOSIS — J189 Pneumonia, unspecified organism: Secondary | ICD-10-CM | POA: Diagnosis present

## 2019-02-15 LAB — CBC WITH DIFFERENTIAL/PLATELET
Abs Immature Granulocytes: 0.02 10*3/uL (ref 0.00–0.07)
Basophils Absolute: 0 10*3/uL (ref 0.0–0.1)
Basophils Relative: 0 %
Eosinophils Absolute: 0.2 10*3/uL (ref 0.0–0.5)
Eosinophils Relative: 4 %
HCT: 31.2 % — ABNORMAL LOW (ref 39.0–52.0)
Hemoglobin: 10.1 g/dL — ABNORMAL LOW (ref 13.0–17.0)
Immature Granulocytes: 0 %
Lymphocytes Relative: 25 %
Lymphs Abs: 1.4 10*3/uL (ref 0.7–4.0)
MCH: 31.2 pg (ref 26.0–34.0)
MCHC: 32.4 g/dL (ref 30.0–36.0)
MCV: 96.3 fL (ref 80.0–100.0)
Monocytes Absolute: 0.6 10*3/uL (ref 0.1–1.0)
Monocytes Relative: 10 %
Neutro Abs: 3.5 10*3/uL (ref 1.7–7.7)
Neutrophils Relative %: 61 %
Platelets: 115 10*3/uL — ABNORMAL LOW (ref 150–400)
RBC: 3.24 MIL/uL — ABNORMAL LOW (ref 4.22–5.81)
RDW: 13 % (ref 11.5–15.5)
WBC: 5.7 10*3/uL (ref 4.0–10.5)
nRBC: 0 % (ref 0.0–0.2)

## 2019-02-15 LAB — COMPREHENSIVE METABOLIC PANEL
ALT: 14 U/L (ref 0–44)
AST: 28 U/L (ref 15–41)
Albumin: 3.3 g/dL — ABNORMAL LOW (ref 3.5–5.0)
Alkaline Phosphatase: 51 U/L (ref 38–126)
Anion gap: 7 (ref 5–15)
BUN: 28 mg/dL — ABNORMAL HIGH (ref 8–23)
CO2: 19 mmol/L — ABNORMAL LOW (ref 22–32)
Calcium: 8.8 mg/dL — ABNORMAL LOW (ref 8.9–10.3)
Chloride: 114 mmol/L — ABNORMAL HIGH (ref 98–111)
Creatinine, Ser: 1.39 mg/dL — ABNORMAL HIGH (ref 0.61–1.24)
GFR calc Af Amer: 52 mL/min — ABNORMAL LOW (ref >60)
GFR calc non Af Amer: 45 mL/min — ABNORMAL LOW (ref >60)
Glucose, Bld: 114 mg/dL — ABNORMAL HIGH (ref 70–99)
Potassium: 3.9 mmol/L (ref 3.5–5.1)
Sodium: 140 mmol/L (ref 135–145)
Total Bilirubin: 0.7 mg/dL (ref 0.3–1.2)
Total Protein: 6.1 g/dL — ABNORMAL LOW (ref 6.5–8.1)

## 2019-02-15 LAB — GLUCOSE, CAPILLARY
Glucose-Capillary: 115 mg/dL — ABNORMAL HIGH (ref 70–99)
Glucose-Capillary: 96 mg/dL (ref 70–99)
Glucose-Capillary: 102 mg/dL — ABNORMAL HIGH (ref 70–99)
Glucose-Capillary: 83 mg/dL (ref 70–99)

## 2019-02-15 LAB — LEGIONELLA PNEUMOPHILA SEROGP 1 UR AG: L. pneumophila Serogp 1 Ur Ag: NEGATIVE

## 2019-02-15 LAB — MAGNESIUM: Magnesium: 1.9 mg/dL (ref 1.7–2.4)

## 2019-02-15 LAB — PHOSPHORUS: Phosphorus: 2.7 mg/dL (ref 2.5–4.6)

## 2019-02-15 MED ORDER — SODIUM BICARBONATE 650 MG PO TABS
650.0000 mg | ORAL_TABLET | Freq: Two times a day (BID) | ORAL | Status: AC
  Administered 2019-02-15 – 2019-02-16 (×3): 650 mg via ORAL
  Filled 2019-02-15 (×5): qty 1

## 2019-02-15 NOTE — TOC Progression Note (Addendum)
Pt's daughter called back and discussed SNF recommendation- she states pt's current status as described per therapy notes is somewhat debilitated compared to baseline. States he typically will walk with a walker sometimes but rarely needs much assistance. States he will not speak to providers and "has white coat syndrome." Pt was getting [facility] at [facility]. Dtr states pt went to SNF for rehab at [facility] 2 years ago and benefited well- would like for him to return there or another SNF if accepted. States she will inform [facility] of plan to pursue SNF at [place]. Completed FL2 and referrals, initiated [facility] authorization request.   Transition of Care [facility]) - Progression Note    Patient Details  Name: Stephen Hester. MRN: [id number] Date of Birth: 1929-01-02  Transition of Care [facility] CM/SW Contact  Nila Nephew, [place] Phone Number: [phone] 02/15/2019, 2:14 PM  Clinical Narrative:   Left voicemail with pt's daughter Stephen Hester to discuss SNF recommendation. Also spoke briefly with Abigail Butts at [facility] (pt's ALF) however phone connectivity issues interfered- will continue attempting their contact to DC plan.    Expected Discharge Plan: Assisted Living Barriers to Discharge: Continued Medical Work up  Expected Discharge Plan and Services Expected Discharge Plan: Assisted Living       Living arrangements for the past 2 months: Assisted Living Facility                                       Social Determinants of Health (SDOH) Interventions    Readmission Risk Interventions Readmission Risk Prevention Plan 02/09/2019  Transportation Screening Complete  Home Care Screening Complete

## 2019-02-15 NOTE — Progress Notes (Signed)
Assumed care of the pt from 1500-1900. No changes in assessment. Cont with  Plan of care. Covid testing obtained for send result before return to facility

## 2019-02-15 NOTE — Progress Notes (Signed)
Occupational Therapy Treatment Patient Details Name: Stephen Hester. MRN: [id number] DOB: 01-27-1929 Today's Date: 02/15/2019    History of present illness Pt admitted with AMS 2* metabolic encephalopathy.  Pt wtih hx of dementia and a-fib   OT comments  Pt total A for grooming/bed mobility today.  Did not follow commands, did not follow through with hand over hand assist. Will adjust goals next tx, if needed  Follow Up Recommendations  SNF    Equipment Recommendations  None recommended by OT    Recommendations for Other Services      Precautions / Restrictions Precautions Precautions: Fall Restrictions Weight Bearing Restrictions: No       Mobility Bed Mobility                  Transfers                      Balance                                           ADL either performed or assessed with clinical judgement   ADL       Grooming: Wash/dry hands;Wash/dry face;Bed level;Total assistance                                 General ADL Comments: hand over hand assistance     Vision       Perception     Praxis      Cognition Arousal/Alertness: Lethargic(aroused) Behavior During Therapy: Flat affect                                   General Comments: h/o dementia; did not follow commands today, hand over hand assist given        Exercises Exercises: Other exercises Other Exercises Other Exercises: AA/PROM to bil UEs FF and elbow flex/extension was well as finger extension as pt tends to fist hands   Shoulder Instructions       General Comments      Pertinent Vitals/ Pain       Pain Assessment: Faces Faces Pain Scale: No hurt  Home Living                                          Prior Functioning/Environment              Frequency  Min 2X/week        Progress Toward Goals  OT Goals(current goals can now be found in the care plan section)  Progress  towards OT goals: Not progressing toward goals - comment;OT to reassess next treatment     Plan      Co-evaluation                 AM-PAC OT "6 Clicks" Daily Activity     Outcome Measure   Help from another person eating meals?: Total Help from another person taking care of personal grooming?: Total Help from another person toileting, which includes using toliet, bedpan, or urinal?: Total Help from another person bathing (including washing, rinsing, drying)?: Total Help from another person to put on and  taking off regular upper body clothing?: Total Help from another person to put on and taking off regular lower body clothing?: Total 6 Click Score: 6    End of Session    OT Visit Diagnosis: Unsteadiness on feet (R26.81)   Activity Tolerance Patient tolerated treatment well   Patient Left in bed;with call bell/phone within reach;with restraints reapplied   Nurse Communication          Time: [id number] OT Time Calculation (min): 13 min  Charges: OT General Charges $OT Visit: 1 Visit OT Treatments $Therapeutic Exercise: 8-22 mins  Stephen OtterMaryellen Danaiya Hester, OTR/L Acute Rehabilitation Services [phone] [facility] pager [phone] office 02/15/2019   Stephen Hester 02/15/2019, 2:20 PM

## 2019-02-15 NOTE — Progress Notes (Signed)
.   Pharmacy Antibiotic Note  Heinz Eckert. is a 85 y.o. male admitted on 2019-02-10 for AMS after an unwitnessed fall that morning. Chest Xray on 7/18 showed findings with suspicion for PNA. Pt was started on cefepime on admission for infection.   Today, 02/15/19: - day 5 of abx - T max 99.2 - WBC WNL - all cultures have been negative thus far  - Scr labile, but down to 1.39 today (estimated CrCl: 36)  Plan: - continue cefepime 2g q12h - please indicate LOT/plan for abx  - follow up renal function   __________________________________  Height: 5\' 8"  (172.7 cm) Weight: 172 lb 4.8 oz (78.2 kg) IBW/kg (Calculated) : 68.4  Temp (24hrs), Avg:98.9 F (37.2 C), Min:98.6 F (37 C), Max:99.2 F (37.3 C)  Recent Labs  Lab 02/11/19 0723 02/12/19 0613 02/13/19 0602 02/14/19 0627 02/15/19 0607  WBC 6.5 7.4 7.0 7.6 5.7  CREATININE 1.49* 1.55* 1.67* 1.69* 1.39*    Estimated Creatinine Clearance: 36.2 mL/min (A) (by C-G formula based on SCr of 1.39 mg/dL (H)).    No Known Allergies  Antimicrobials this admission: 7/18 Cefepime>>   Dose adjustments this admission: --  Microbiology results: 7/15UCx: NG-F  7/15 COVID: negative 7/18 MRSA PCR:neg 7/18 Legionella: in process 7/18 S. Pneumo: neg   Thank you for allowing pharmacy to be a part of this patient's care.  Jonathon Bellows 02/15/2019 1:01 PM

## 2019-02-15 NOTE — NC FL2 (Signed)
[place] LEVEL OF CARE SCREENING TOOL     IDENTIFICATION  Patient Name: Stephen Hester. Birthdate: 08-19-1928 Sex: male Admission Date (Current Location): 03/04/2019  [facility] and [place] Number:  Herbalist and Address:  [facility],  [place], [place]      Provider Number: [id number]  Attending Physician Name and Address:  Eugenie Filler, MD  Relative Name and Phone Number:  daughter Pamala Hurry [phone]    Current Level of Care: Hospital Recommended Level of Care: [place] Prior Approval Number:    Date Approved/Denied:   PASRR Number: [id number]  Discharge Plan: SNF    Current Diagnoses: Patient Active Problem List   Diagnosis Date Noted  . Acute encephalopathy 2019-03-04  . Chronic pain of right knee 09/07/2018    Orientation RESPIRATION BLADDER Height & Weight     Self, Situation, Place  Normal Incontinent Weight: 172 lb 4.8 oz (78.2 kg) Height:  5\' 8"  (172.7 cm)  BEHAVIORAL SYMPTOMS/MOOD NEUROLOGICAL BOWEL NUTRITION STATUS      Incontinent Diet(dysphasia II diet)  AMBULATORY STATUS COMMUNICATION OF NEEDS Skin   Extensive Assist Verbally(does not speak to providers) Normal                       Personal Care Assistance Level of Assistance  Bathing, Feeding, Dressing Bathing Assistance: Maximum assistance Feeding assistance: Independent Dressing Assistance: Maximum assistance     Functional Limitations Info  Sight, Hearing, Speech Sight Info: Adequate Hearing Info: Adequate Speech Info: Adequate(selective speech)    SPECIAL CARE FACTORS FREQUENCY  PT (By licensed PT), OT (By licensed OT), Speech therapy     PT Frequency: 5x OT Frequency: 5x     Speech Therapy Frequency: 1x      Contractures Contractures Info: Not present    Additional Factors Info  Code Status, Allergies Code Status Info: full code Allergies Info: nka           Current Medications  (02/15/2019):  This is the current hospital active medication list Current Facility-Administered Medications  Medication Dose Route Frequency Provider Last Rate Last Dose  . acetaminophen (TYLENOL) tablet 650 mg  650 mg Oral Q6H PRN Mercy Riding, MD       Or  . acetaminophen (TYLENOL) suppository 650 mg  650 mg Rectal Q6H PRN Wendee Beavers T, MD      . apixaban (ELIQUIS) tablet 2.5 mg  2.5 mg Oral BID Sheikh, Omair Latif, DO   2.5 mg at 02/15/19 1014  . ceFEPIme (MAXIPIME) 2 g in sodium chloride 0.9 % 100 mL IVPB  2 g Intravenous Q12H Thomes Lolling, RPH 200 mL/hr at 02/15/19 1532 2 g at 02/15/19 1532  . guaiFENesin (MUCINEX) 12 hr tablet 1,200 mg  1,200 mg Oral BID Sheikh, Omair Latif, DO   1,200 mg at 02/15/19 1014  . haloperidol lactate (HALDOL) injection 1 mg  1 mg Intravenous Once Jani Gravel, MD      . insulin aspart (novoLOG) injection 0-5 Units  0-5 Units Subcutaneous QHS Mercy Riding, MD   4 Units at 02/13/19 2207  . insulin aspart (novoLOG) injection 0-9 Units  0-9 Units Subcutaneous TID WC Mercy Riding, MD   2 Units at 02/14/19 1805  . insulin aspart (novoLOG) injection 10 Units  10 Units Subcutaneous Once Gonfa, Taye T, MD      . insulin aspart (novoLOG) injection 3 Units  3 Units Subcutaneous TID WC Sheikh,  Heloise Beechammair Latif, DO   3 Units at 02/15/19 [phone]  . insulin detemir (LEVEMIR) injection 10 Units  10 Units Subcutaneous BID Almon HerculesGonfa, Taye T, MD   10 Units at 02/15/19 1014  . ipratropium-albuterol (DUONEB) 0.5-2.5 (3) MG/3ML nebulizer solution 3 mL  3 mL Nebulization Q8H PRN Marguerita MerlesSheikh, Omair Latif, DO   3 mL at 02/11/19 1439  . magnesium oxide (MAG-OX) tablet 400 mg  400 mg Oral QODAY Sheikh, Omair MayfieldLatif, DO   400 mg at 02/15/19 1014  . metoprolol succinate (TOPROL-XL) 24 hr tablet 50 mg  50 mg Oral Daily Marguerita MerlesSheikh, Omair SalvoLatif, DO   50 mg at 02/15/19 1014  . polyethylene glycol (MIRALAX / GLYCOLAX) packet 17 g  17 g Oral Daily PRN Candelaria StagersGonfa, Taye T, MD      . rosuvastatin (CRESTOR) tablet 10 mg   10 mg Oral QHS Sheikh, Kateri McOmair TriangleLatif, DO   10 mg at 02/14/19 2143  . senna (SENOKOT) tablet 8.6 mg  1 tablet Oral BID Candelaria StagersGonfa, Taye T, MD   8.6 mg at 02/15/19 1014  . sodium bicarbonate tablet 650 mg  650 mg Oral BID Rodolph Bonghompson, Daniel V, MD   650 mg at 02/15/19 1014  . sodium chloride flush (NS) 0.9 % injection 10-40 mL  10-40 mL Intracatheter Q12H Sheikh, Omair Latif, DO      . sodium chloride flush (NS) 0.9 % injection 10-40 mL  10-40 mL Intracatheter PRN Sheikh, Omair Latif, DO      . vitamin B-12 (CYANOCOBALAMIN) tablet 1,000 mcg  1,000 mcg Oral Daily Marguerita MerlesSheikh, Omair Latif, DO   1,000 mcg at 02/15/19 1014     Discharge Medications: Please see discharge summary for a list of discharge medications.  Relevant Imaging Results:  Relevant Lab Results:   Additional Information SS# [id number]  Nelwyn SalisburyMeghan R Astin Sayre, LCSW

## 2019-02-15 NOTE — Progress Notes (Signed)
PROGRESS NOTE    Stephen Hester.  ZOX:[id number] DOB: 1929-05-24 DOA: 02/06/2019 PCP: [facility]    Brief Narrative:  HPI per Dr. Candelaria Stagers on 02/18/2019 Stephen Hesteris a 85 y.o.malewith history ofdiabetes, dementia, hypertensionandA. fib on Eliquis presenting with unwitnessed fall and altered mental status.  Patient was not able to provide history due to encephalopathy/dementia.History based on chart review and discussion with EDP. Minimally follows command. Does not appear to be in distress. Trying to get out of the bed.Responds no to pain.  Called and talked to patient's daughter who had a call from facility saying he had unwitnessed fall.She was told his eyes were rolling backward. She did not have more detail. She confirms with me that he is full code.  Per EMS, not able to do stroke screen due to encephalopathy. Was given 500 cc normal saline bolus. Vital signs not impressive. CBG elevated to 378.  In ED, hemodynamically stable.100% on room air. Hgb 12.7. MCV 100. Platelet 124.Sodium 152. Bicarb 17. Glucose 235. BUN 60. Creatinine 2.99 (no baseline to compare to). Anion gap 16. Otherwise CMP not impressive. High-sensitivity troponin 36. EKG withsinus rhythm, PVCs, TW bilaterally and QTC to 580s. Urinalysis with glucose greater than 500. No ketones. Portable CXR, CT head and cervical spine without acute finding. COVID-19 negative. Was given normal saline bolus 1 L and hospitalist service was called for admission for encephalopathy and fall.   Assessment & Plan:   Active Problems:   Acute encephalopathy  1 acute metabolic encephalopathy in the setting of underlying dementia Questionable etiology.  Likely secondary to suspected pneumonia and hyponatremia. Patient admitted with mild DKA based on his labs and dehydration.  Neurological exam done on admission was limited due to patient's inability to cooperate as well as  somnolence.  Patient more alert today following and tracking but not speaking.  Head CT and C-spine done with no acute abnormalities.  Repeat chest x-ray done concerning for bronchitis versus pneumonia.  Patient hydrated with IV fluids which were changed to D5W due to hypernatremia.  Patient being followed by speech and patient started on dysphagia 2 diet with thin liquids.  Continue IV fluid resuscitation.  Continue empiric antibiotics.  Will need to find patient's baseline.  2.  Left-sided pneumonia versus acute bronchitis, POA Patient on admission noted to be severely dehydrated and as such pneumonia fluffed out after hydration.  Continue empiric IV cefepime.  Continue flutter valve, incentive spirometry.  Procalcitonin level was less than 0.103.  Patient still with some intermittent cough.  Sputum Gram stain and cultures pending.  Urine Legionella antigen negative.  Urine pneumococcus antigen negative.  SARS-CoV-2 negative.  Treat empirically for 5 to 7 days of antibiotics.  Supportive care.  Follow.  3.  Paroxysmal atrial fibrillation Currently rate controlled on metoprolol.  Eliquis for anticoagulation.  Follow.  4.  Hypernatremia Likely secondary to hypovolemic hyponatremia in the setting of dehydration and dementia.  Sodium levels were 152 on admission.  Patient hydrated with IV fluids and initially also placed on D5W with improvement with hypernatremia.  IV fluids have been saline locked.  Follow and hydrate as needed.  5.  Acute kidney injury/metabolic acidosis Likely secondary to prerenal azotemia as patient noted to be hyponatremic and dehydrated on admission.  Creatinine on admission was 2.99 with a BUN of 60.  Patient also noted to be on low-dose lisinopril and Aldactone which have been held during this hospitalization.  Renal ultrasound negative for hydronephrosis or obstructive changes  noted.  Patient hydrated with IV fluids with improvement with renal function.  Creatinine down to 1.39.   IV fluids have been saline locked.  Follow.  6.  Mild DKA/uncontrolled insulin-dependent diabetes mellitus type 2 Patient noted to have an anion gap of 16 with a bicarb of 17 and glucose of 236.  Patient noted to be on Levemir 27 units nightly.  Patient hydrated with IV fluids.  Hemoglobin A1c 9.6 on 02/09/2019.  CBG of 115 this morning.  Continue current regimen of Levemir, sliding scale insulin.  Discontinue meal coverage insulin.  Follow.  7.  Hypertension Continue current regimen metoprolol.  8.  QTC prolongation QTC noted to be prolonged at 580.  Repeat EKG with QTC of 470.  Continue to hold Zoloft.  Keep magnesium greater than 2.  Keep potassium greater than 4.  Follow.  9.  Elevated troponin Patient noted to have elevated troponin felt likely to be a demand ischemia secondary to dehydration and delayed clearance from acute kidney injury.  Patient with no baseline EKG to compare to however did have some T wave inversions on the lateral leads.  High-sensitivity troponin trended down.  Patient with no overt chest pain.  Continue metoprolol and Eliquis.  Continue statin.  Follow.  10.  Normocytic anemia Likely drop in hemoglobin dilutional in nature.  Patient with no overt bleeding.  Follow H&H.  Transfusion threshold hemoglobin less than 7.  11.  Thrombocytopenia Stable.  Likely secondary to dehydration in the setting of pneumonia.  Patient with no overt bleeding.  Getting second in the setting of pneumonia.  Platelet count trending back up.  Follow.  12.  Hypokalemia Potassium at 3.9.  Follow.   DVT prophylaxis: Eliquis Code Status: Full Family Communication: No family at bedside. Disposition Plan: To be determined.   Consultants:   None  Procedures:  CT head CT C-spine 02/11/2019  Chest x-ray 02/20/2019, 02/11/2019, 02/12/2019, 02/13/2019  Renal ultrasound 02/13/2019  Antimicrobials:   IV cefepime 02/11/2019   Subjective: Patient laying in bed.  Alert.  Tracking.  Not  speaking.  Objective: Vitals:   02/14/19 0603 02/14/19 1257 02/14/19 1939 02/15/19 0625  BP: 123/89 (!) 146/69 139/70 (!) 155/72  Pulse: 67 68 86 75  Resp: 18 18 18    Temp: 98.3 F (36.8 C) 99.3 F (37.4 C) 98.6 F (37 C) 99.2 F (37.3 C)  TempSrc: Oral Oral Oral Oral  SpO2: 98% 95% 97% 99%  Weight:      Height:        Intake/Output Summary (Last 24 hours) at 02/15/2019 1205 Last data filed at 02/14/2019 1500 Gross per 24 hour  Intake 200 ml  Output 1003 ml  Net -803 ml   Filed Weights   02/09/19 1000  Weight: 78.2 kg    Examination:  General exam: Alert.  Dry mucous membranes. Respiratory system: Clear to auscultation anterior lung fields. Respiratory effort normal. Cardiovascular system: S1 & S2 heard, RRR. No JVD, murmurs, rubs, gallops or clicks. No pedal edema. Gastrointestinal system: Abdomen is nondistended, soft and nontender. No organomegaly or masses felt. Normal bowel sounds heard. Central nervous system: Alert.  Moving extremities spontaneously.  Extremities: Symmetric 5 x 5 power. Skin: No rashes, lesions or ulcers Psychiatry: Judgement and insight appear normal. Mood & affect appropriate.     Data Reviewed: I have personally reviewed following labs and imaging studies  CBC: Recent Labs  Lab 02/11/19 0723 02/12/19 0613 02/13/19 0602 02/14/19 0627 02/15/19 0607  WBC 6.5 7.4 7.0 7.6  5.7  NEUTROABS 3.9 4.8 4.4 4.9 3.5  HGB 10.5* 10.6* 10.2* 10.0* 10.1*  HCT 31.9* 32.2* 30.7* 29.9* 31.2*  MCV 93.0 94.2 94.2 92.3 96.3  PLT 87* 89* 83* 102* 115*   Basic Metabolic Panel: Recent Labs  Lab 02/11/19 0723 02/12/19 0613 02/13/19 0602 02/14/19 0627 02/15/19 0607  NA 139 138 138 135 140  K 4.4 4.3 4.0 4.1 3.9  CL 111 109 111 108 114*  CO2 19* 20* 19* 19* 19*  GLUCOSE 203* 239* 125* 167* 114*  BUN 30* 31* 32* 35* 28*  CREATININE 1.49* 1.55* 1.67* 1.69* 1.39*  CALCIUM 8.6* 8.5* 8.4* 8.4* 8.8*  MG 1.8 1.8 1.8 1.7 1.9  PHOS 2.8 2.7 2.8 2.9 2.7    GFR: Estimated Creatinine Clearance: 36.2 mL/min (A) (by C-G formula based on SCr of 1.39 mg/dL (H)). Liver Function Tests: Recent Labs  Lab 02/11/19 0723 02/12/19 0613 02/13/19 0602 02/14/19 0627 02/15/19 0607  AST 39 32 30 31 28   ALT 13 16 15 15 14   ALKPHOS 60 60 53 55 51  BILITOT 0.6 0.4 0.6 0.6 0.7  PROT 5.8* 5.7* 5.7* 5.9* 6.1*  ALBUMIN 3.3* 3.2* 3.3* 3.1* 3.3*   No results for input(s): LIPASE, AMYLASE in the last 168 hours. No results for input(s): AMMONIA in the last 168 hours. Coagulation Profile: No results for input(s): INR, PROTIME in the last 168 hours. Cardiac Enzymes: Recent Labs  Lab 02/14/2019 1857  CKTOTAL 607*   BNP (last 3 results) No results for input(s): PROBNP in the last 8760 hours. HbA1C: No results for input(s): HGBA1C in the last 72 hours. CBG: Recent Labs  Lab 02/14/19 0714 02/14/19 1104 02/14/19 1600 02/14/19 2056 02/15/19 0808  GLUCAP 177* 187* 196* 189* 115*   Lipid Profile: No results for input(s): CHOL, HDL, LDLCALC, TRIG, CHOLHDL, LDLDIRECT in the last 72 hours. Thyroid Function Tests: No results for input(s): TSH, T4TOTAL, FREET4, T3FREE, THYROIDAB in the last 72 hours. Anemia Panel: No results for input(s): VITAMINB12, FOLATE, FERRITIN, TIBC, IRON, RETICCTPCT in the last 72 hours. Sepsis Labs: Recent Labs  Lab 02/11/19 1418 02/12/19 0613 02/13/19 0602  PROCALCITON <0.10 <0.10 <0.10    Recent Results (from the past 240 hour(s))  Urine culture     Status: None   Collection Time: 02/14/2019 12:29 PM   Specimen: Urine, Random  Result Value Ref Range Status   Specimen Description   Final    URINE, RANDOM Performed at [facility], [address] [place], [place] [address]    Special Requests   Final    NONE Performed at [facility], [address] [place], [place] [address]    Culture   Final    NO GROWTH Performed at [facility], [address] [place], [place]  [address]    Report Status 02/09/2019 FINAL  Final  SARS Coronavirus 2 (CEPHEID- Performed in [facility] hospital lab), Hosp Order     Status: None   Collection Time: 02/02/2019  1:14 PM   Specimen: Nasopharyngeal Swab  Result Value Ref Range Status   SARS Coronavirus 2 NEGATIVE NEGATIVE Final    Comment: (NOTE) If result is NEGATIVE SARS-CoV-2 target nucleic acids are NOT DETECTED. The SARS-CoV-2 RNA is generally detectable in upper and lower  respiratory specimens during the acute phase of infection. The lowest  concentration of SARS-CoV-2 viral copies this assay can detect is 250  copies / mL. A negative result does not preclude SARS-CoV-2 infection  and should not be used as the sole basis for treatment or other  patient management decisions.  A negative result may occur with  improper specimen collection / handling, submission of specimen other  than nasopharyngeal swab, presence of viral mutation(s) within the  areas targeted by this assay, and inadequate number of viral copies  (<250 copies / mL). A negative result must be combined with clinical  observations, patient history, and epidemiological information. If result is POSITIVE SARS-CoV-2 target nucleic acids are DETECTED. The SARS-CoV-2 RNA is generally detectable in upper and lower  respiratory specimens dur ing the acute phase of infection.  Positive  results are indicative of active infection with SARS-CoV-2.  Clinical  correlation with patient history and other diagnostic information is  necessary to determine patient infection status.  Positive results do  not rule out bacterial infection or co-infection with other viruses. If result is PRESUMPTIVE POSTIVE SARS-CoV-2 nucleic acids MAY BE PRESENT.   A presumptive positive result was obtained on the submitted specimen  and confirmed on repeat testing.  While 2019 novel coronavirus  (SARS-CoV-2) nucleic acids may be present in the submitted sample  additional confirmatory  testing may be necessary for epidemiological  and / or clinical management purposes  to differentiate between  SARS-CoV-2 and other Sarbecovirus currently known to infect humans.  If clinically indicated additional testing with an alternate test  methodology [phone]([id number]) is advised. The SARS-CoV-2 RNA is generally  detectable in upper and lower respiratory sp ecimens during the acute  phase of infection. The expected result is Negative. Fact Sheet for Patients:  BoilerBrush.com.cyhttps://www.fda.gov/media/136312/download Fact Sheet for Healthcare Providers: https://pope.com/https://www.fda.gov/media/136313/download This test is not yet approved or cleared by the [place] FDA and has been authorized for detection and/or diagnosis of SARS-CoV-2 by FDA under an Emergency Use Authorization (EUA).  This EUA will remain in effect (meaning this test can be used) for the duration of the COVID-19 declaration under Section 564(b)(1) of the Act, 21 U.S.C. section 360bbb-3(b)(1), unless the authorization is terminated or revoked sooner. Performed at [facility], [address] [place], [place] [address]   MRSA PCR Screening     Status: None   Collection Time: 02/11/19  1:03 PM   Specimen: Nasopharyngeal  Result Value Ref Range Status   MRSA by PCR NEGATIVE NEGATIVE Final    Comment:        The GeneXpert MRSA Assay (FDA approved for NASAL specimens only), is one component of a comprehensive MRSA colonization surveillance program. It is not intended to diagnose MRSA infection nor to guide or monitor treatment for MRSA infections. Performed at [facility], [address] [place], [place] [address]          Radiology Studies: [place] Renal  Result Date: 02/13/2019 CLINICAL DATA:  Acute renal injury EXAM: RENAL / URINARY TRACT ULTRASOUND COMPLETE COMPARISON:  None. FINDINGS: Right Kidney: Renal measurements: 10.3 x 5.3 x 5.5 cm. = volume: 157 mL. 8 mm subcortical cyst is noted. No  obstructive changes are seen Left Kidney: Renal measurements: 10.3 x 4.8 x 4.9 cm. = volume: 128 mL. Echogenicity within normal limits. No mass or hydronephrosis visualized. Bladder: Appears normal for degree of bladder distention. IMPRESSION: Tiny right renal cyst. No obstructive changes are noted. Electronically Signed   By: Alcide CleverMark  Lukens M.D.   On: 02/13/2019 20:50        Scheduled Meds: . apixaban  2.5 mg Oral BID  . guaiFENesin  1,200 mg Oral BID  .  haloperidol lactate  1 mg Intravenous Once  . insulin aspart  0-5 Units Subcutaneous QHS  . insulin aspart  0-9 Units Subcutaneous TID WC  . insulin aspart  10 Units Subcutaneous Once  . insulin aspart  3 Units Subcutaneous TID WC  . insulin detemir  10 Units Subcutaneous BID  . magnesium oxide  400 mg Oral QODAY  . metoprolol succinate  50 mg Oral Daily  . rosuvastatin  10 mg Oral QHS  . senna  1 tablet Oral BID  . sodium bicarbonate  650 mg Oral BID  . sodium chloride flush  10-40 mL Intracatheter Q12H  . vitamin B-12  1,000 mcg Oral Daily   Continuous Infusions: . ceFEPime (MAXIPIME) IV 2 g (02/15/19 [id number])     LOS: 6 days    Time spent: 35 minutes    Ramiro Harvestaniel Sharline Lehane, MD [facility]  If 7PM-7AM, please contact night-coverage [email or web address] 02/15/2019, 12:05 PM

## 2019-02-16 ENCOUNTER — Inpatient Hospital Stay (HOSPITAL_COMMUNITY): Payer: Medicare PPO

## 2019-02-16 LAB — CBC WITH DIFFERENTIAL/PLATELET
Abs Immature Granulocytes: 0.02 10*3/uL (ref 0.00–0.07)
Basophils Absolute: 0 10*3/uL (ref 0.0–0.1)
Basophils Relative: 0 %
Eosinophils Absolute: 0.2 10*3/uL (ref 0.0–0.5)
Eosinophils Relative: 4 %
HCT: 30.4 % — ABNORMAL LOW (ref 39.0–52.0)
Hemoglobin: 9.9 g/dL — ABNORMAL LOW (ref 13.0–17.0)
Immature Granulocytes: 0 %
Lymphocytes Relative: 20 %
Lymphs Abs: 1 10*3/uL (ref 0.7–4.0)
MCH: 31.1 pg (ref 26.0–34.0)
MCHC: 32.6 g/dL (ref 30.0–36.0)
MCV: 95.6 fL (ref 80.0–100.0)
Monocytes Absolute: 0.6 10*3/uL (ref 0.1–1.0)
Monocytes Relative: 11 %
Neutro Abs: 3.5 10*3/uL (ref 1.7–7.7)
Neutrophils Relative %: 65 %
Platelets: 129 10*3/uL — ABNORMAL LOW (ref 150–400)
RBC: 3.18 MIL/uL — ABNORMAL LOW (ref 4.22–5.81)
RDW: 13.1 % (ref 11.5–15.5)
WBC: 5.3 10*3/uL (ref 4.0–10.5)
nRBC: 0 % (ref 0.0–0.2)

## 2019-02-16 LAB — BASIC METABOLIC PANEL
Anion gap: 8 (ref 5–15)
BUN: 24 mg/dL — ABNORMAL HIGH (ref 8–23)
CO2: 19 mmol/L — ABNORMAL LOW (ref 22–32)
Calcium: 8.8 mg/dL — ABNORMAL LOW (ref 8.9–10.3)
Chloride: 112 mmol/L — ABNORMAL HIGH (ref 98–111)
Creatinine, Ser: 1.32 mg/dL — ABNORMAL HIGH (ref 0.61–1.24)
GFR calc Af Amer: 56 mL/min — ABNORMAL LOW (ref >60)
GFR calc non Af Amer: 48 mL/min — ABNORMAL LOW (ref >60)
Glucose, Bld: 82 mg/dL (ref 70–99)
Potassium: 4 mmol/L (ref 3.5–5.1)
Sodium: 139 mmol/L (ref 135–145)

## 2019-02-16 LAB — GLUCOSE, CAPILLARY
Glucose-Capillary: 82 mg/dL (ref 70–99)
Glucose-Capillary: 77 mg/dL (ref 70–99)
Glucose-Capillary: 92 mg/dL (ref 70–99)
Glucose-Capillary: 76 mg/dL (ref 70–99)
Glucose-Capillary: 110 mg/dL — ABNORMAL HIGH (ref 70–99)

## 2019-02-16 LAB — AMMONIA: Ammonia: 36 umol/L — ABNORMAL HIGH (ref 9–35)

## 2019-02-16 MED ORDER — APIXABAN 5 MG PO TABS
5.0000 mg | ORAL_TABLET | Freq: Two times a day (BID) | ORAL | Status: DC
  Administered 2019-02-16 (×2): 5 mg via ORAL
  Filled 2019-02-16 (×3): qty 1

## 2019-02-16 MED ORDER — BISACODYL 10 MG RE SUPP
10.0000 mg | Freq: Every day | RECTAL | Status: DC
  Administered 2019-02-17 – 2019-02-19 (×2): 10 mg via RECTAL
  Filled 2019-02-16 (×2): qty 1

## 2019-02-16 MED ORDER — DEXTROSE-NACL 5-0.9 % IV SOLN
INTRAVENOUS | Status: DC
  Administered 2019-02-16: 16:00:00 via INTRAVENOUS

## 2019-02-16 MED ORDER — LACTULOSE 10 GM/15ML PO SOLN
20.0000 g | Freq: Once | ORAL | Status: DC
  Filled 2019-02-16: qty 30

## 2019-02-16 MED ORDER — INSULIN DETEMIR 100 UNIT/ML ~~LOC~~ SOLN
10.0000 [IU] | Freq: Every day | SUBCUTANEOUS | Status: DC
  Administered 2019-02-17: 10:00:00 10 [IU] via SUBCUTANEOUS
  Filled 2019-02-16 (×2): qty 0.1

## 2019-02-16 NOTE — Progress Notes (Addendum)
PROGRESS NOTE    Stephen Hester.  ZJQ:[id number] DOB: 03-31-1929 DOA: 2019-02-17 PCP: [facility]    Brief Narrative:  HPI per Dr. Wendee Beavers on 2019/02/17 Stephen Hesteris a 85 y.o.malewith history ofdiabetes, dementia, hypertensionandA. fib on Eliquis presenting with unwitnessed fall and altered mental status.  Patient was not able to provide history due to encephalopathy/dementia.History based on chart review and discussion with EDP. Minimally follows command. Does not appear to be in distress. Trying to get out of the bed.Responds no to pain.  Called and talked to patient's daughter who had a call from facility saying he had unwitnessed fall.She was told his eyes were rolling backward. She did not have more detail. She confirms with me that he is full code.  Per EMS, not able to do stroke screen due to encephalopathy. Was given 500 cc normal saline bolus. Vital signs not impressive. CBG elevated to 378.  In ED, hemodynamically stable.100% on room air. Hgb 12.7. MCV 100. Platelet 124.Sodium 152. Bicarb 17. Glucose 235. BUN 60. Creatinine 2.99 (no baseline to compare to). Anion gap 16. Otherwise CMP not impressive. High-sensitivity troponin 36. EKG withsinus rhythm, PVCs, TW bilaterally and QTC to 580s. Urinalysis with glucose greater than 500. No ketones. Portable CXR, CT head and cervical spine without acute finding. COVID-19 negative. Was given normal saline bolus 1 L and hospitalist service was called for admission for encephalopathy and fall.   Assessment & Plan:   Active Problems:   Acute encephalopathy   AKI (acute kidney injury) ([place])   Elevated troponin   Hypernatremia   Community acquired pneumonia   Paroxysmal A-fib (HCC)   QT prolongation   Hypokalemia  1 acute metabolic encephalopathy in the setting of underlying dementia Questionable etiology.  Likely secondary to suspected pneumonia and hypernatremia. Patient  admitted with mild DKA based on his labs and dehydration.  Neurological exam done on admission was limited due to patient's inability to cooperate as well as somnolence.  Patient more alert today and tracking however not speaking and not following commands.  Patient not participating with PT like he did a few days ago per nurse tech.  Patient not eating and refusing to open his mouth. Head CT and C-spine done with no acute abnormalities.  Repeat chest x-ray done concerning for bronchitis versus pneumonia.  Patient hydrated with IV fluids which were changed to D5W due to hypernatremia.  Patient being followed by speech and patient started on dysphagia 2 diet with thin liquids.  Patient refusing to open his mouth will have speech therapy reassess.  Patient seems to be regressing and as such we will get a MRI of the head, RBC folate, RPR, ammonia level.  Patient not at baseline.  Place back on gentle hydration and follow.  2.  Left-sided pneumonia versus acute bronchitis, POA Patient on admission noted to be severely dehydrated and as such pneumonia fluffed out after hydration.  Continue empiric IV cefepime.  Continue flutter valve, incentive spirometry.  Procalcitonin level was less than 0.103.  Patient still with some intermittent cough.  Sputum Gram stain and cultures pending.  Urine Legionella antigen negative.  Urine pneumococcus antigen negative.  SARS-CoV-2 negative.  Treat empirically for 5 to 7 days of antibiotics.  Supportive care.  Follow.  3.  Paroxysmal atrial fibrillation Continue metoprolol for rate control.  Eliquis for anticoagulation.  4.  Hypernatremia Likely secondary to hypovolemic hyponatremia in the setting of dehydration and dementia.  Sodium levels were 152 on admission.  Patient hydrated with IV fluids and initially also placed on D5W with improvement with hypernatremia.  IV fluids have been saline locked.  Follow and hydrate as needed.  5.  Acute kidney injury/metabolic acidosis  Likely secondary to prerenal azotemia as patient noted to be hyponatremic and dehydrated on admission.  Creatinine on admission was 2.99 with a BUN of 60.  Patient also noted to be on low-dose lisinopril and Aldactone which have been held during this hospitalization.  Renal ultrasound negative for hydronephrosis or obstructive changes noted.  Patient hydrated with IV fluids with improvement with renal function.  Creatinine down to 1.32.  IV fluids have been saline locked.  Follow.  6.  Mild DKA/uncontrolled insulin-dependent diabetes mellitus type 2 Patient noted to have an anion gap of 16 with a bicarb of 17 and glucose of 236.  Patient noted to be on Levemir 27 units nightly.  Patient hydrated with IV fluids.  Hemoglobin A1c 9.6 on 02/09/2019.  CBG of 82 this morning.  Patient with poor oral intake.  Decrease Levemir to 10 units daily due to poor oral intake.  Continue sliding scale insulin.  Meal coverage insulin has been discontinued.  Follow.    7.  Hypertension Continue metoprolol.   8.  QTC prolongation QTC noted to be prolonged at 580.  Repeat EKG with QTC of 470.  Will resume patient's Zoloft.  Keep magnesium greater than 2.  Keep potassium greater than 4.  Follow.  9.  Elevated troponin Patient noted to have elevated troponin felt likely to be a demand ischemia secondary to dehydration and delayed clearance from acute kidney injury.  Patient with no baseline EKG to compare to however did have some T wave inversions on the lateral leads.  High-sensitivity troponin trended down.  Patient with no overt chest pain.  Continue metoprolol and Eliquis.  Continue statin.  Follow.  10.  Normocytic anemia Likely drop in hemoglobin dilutional in nature.  Patient with no overt bleeding.  Follow H&H.  Transfusion threshold hemoglobin less than 7.  11.  Thrombocytopenia Stable.  Likely secondary to dehydration in the setting of pneumonia.  Patient with no overt bleeding.  May be in the setting of  pneumonia.  Platelet count trending back up.  Follow.  12.  Hypokalemia Potassium at 3.9.  Follow.   DVT prophylaxis: Eliquis Code Status: Full Family Communication: Updated daughter via telephone. Disposition Plan: To be determined.   Consultants:   None  Procedures:  CT head CT C-spine February 19, 2019  Chest x-ray 19-Feb-2019, 02/11/2019, 02/12/2019, 02/13/2019  Renal ultrasound 02/13/2019  Antimicrobials:   IV cefepime 02/11/2019   Subjective: Patient in bed, alert, tracking with his eyes however not speaking.  Patient follows some commands.  Patient not eating.  Per nurse tech who took care of patient a few days ago patient was able to participate with physical therapy and was opening his mouth to get fed as well as eating.  Patient refuses to open his mouth per nurse tech and per RN.   Objective: Vitals:   02/15/19 1357 02/15/19 1956 02/16/19 0630 02/16/19 0643  BP: (!) 147/98 (!) 155/72 (!) 208/194 (!) 167/90  Pulse: 67 69 77   Resp: Temp: 98 F (36.7 C) 98.2 F (36.8 C) 98 F (36.7 C)   TempSrc: Oral Oral Oral   SpO2: 100% 99% 100%   Weight:      Height:        Intake/Output Summary (Last 24 hours) at 02/16/2019  1230 Last data filed at 02/16/2019 1013 Gross per 24 hour  Intake 870 ml  Output 1050 ml  Net -180 ml   Filed Weights   02/09/19 1000  Weight: 78.2 kg    Examination:  General exam: Alert.  Dry mucous membranes. Respiratory system: Lungs clear to auscultation bilaterally anterior lung fields.  No wheezing, no crackles, no rhonchi.  Normal respiratory effort.  Cardiovascular system: Regular rate rhythm no murmurs rubs or gallops.  No JVD.  No lower extremity edema.  Gastrointestinal system: Abdomen is soft, nontender, nondistended, positive bowel sounds.  No rebound.  No guarding. Central nervous system: Alert.  Moving extremities spontaneously.  Extremities: Symmetric 5 x 5 power. Skin: No rashes, lesions or ulcers Psychiatry: Judgement  and insight appear poor. Mood & affect appropriate.     Data Reviewed: I have personally reviewed following labs and imaging studies  CBC: Recent Labs  Lab 02/12/19 0613 02/13/19 0602 02/14/19 0627 02/15/19 0607 02/16/19 0652  WBC 7.4 7.0 7.6 5.7 5.3  NEUTROABS 4.8 4.4 4.9 3.5 3.5  HGB 10.6* 10.2* 10.0* 10.1* 9.9*  HCT 32.2* 30.7* 29.9* 31.2* 30.4*  MCV 94.2 94.2 92.3 96.3 95.6  PLT 89* 83* 102* 115* 129*   Basic Metabolic Panel: Recent Labs  Lab 02/11/19 0723 02/12/19 0613 02/13/19 0602 02/14/19 0627 02/15/19 0607 02/16/19 0652  NA 139 138 138 135 140 139  K 4.4 4.3 4.0 4.1 3.9 4.0  CL 111 109 111 108 114* 112*  CO2 19* 20* 19* 19* 19* 19*  GLUCOSE 203* 239* 125* 167* 114* 82  BUN 30* 31* 32* 35* 28* 24*  CREATININE 1.49* 1.55* 1.67* 1.69* 1.39* 1.32*  CALCIUM 8.6* 8.5* 8.4* 8.4* 8.8* 8.8*  MG 1.8 1.8 1.8 1.7 1.9  --   PHOS 2.8 2.7 2.8 2.9 2.7  --    GFR: Estimated Creatinine Clearance: 38.1 mL/min (A) (by C-G formula based on SCr of 1.32 mg/dL (H)). Liver Function Tests: Recent Labs  Lab 02/11/19 0723 02/12/19 0613 02/13/19 0602 02/14/19 0627 02/15/19 0607  AST 39 32 30 31 28   ALT 13 16 15 15 14   ALKPHOS 60 60 53 55 51  BILITOT 0.6 0.4 0.6 0.6 0.7  PROT 5.8* 5.7* 5.7* 5.9* 6.1*  ALBUMIN 3.3* 3.2* 3.3* 3.1* 3.3*   No results for input(s): LIPASE, AMYLASE in the last 168 hours. No results for input(s): AMMONIA in the last 168 hours. Coagulation Profile: No results for input(s): INR, PROTIME in the last 168 hours. Cardiac Enzymes: No results for input(s): CKTOTAL, CKMB, CKMBINDEX, TROPONINI in the last 168 hours. BNP (last 3 results) No results for input(s): PROBNP in the last 8760 hours. HbA1C: No results for input(s): HGBA1C in the last 72 hours. CBG: Recent Labs  Lab 02/15/19 1651 02/15/19 2032 02/16/19 0632 02/16/19 0814 02/16/19 1152  GLUCAP 102* 83 82 77 92   Lipid Profile: No results for input(s): CHOL, HDL, LDLCALC, TRIG, CHOLHDL,  LDLDIRECT in the last 72 hours. Thyroid Function Tests: No results for input(s): TSH, T4TOTAL, FREET4, T3FREE, THYROIDAB in the last 72 hours. Anemia Panel: No results for input(s): VITAMINB12, FOLATE, FERRITIN, TIBC, IRON, RETICCTPCT in the last 72 hours. Sepsis Labs: Recent Labs  Lab 02/11/19 1418 02/12/19 0613 02/13/19 0602  PROCALCITON <0.10 <0.10 <0.10    Recent Results (from the past 240 hour(s))  Urine culture     Status: None   Collection Time: 01/31/2019 12:29 PM   Specimen: Urine, Random  Result Value Ref Range Status  Specimen Description   Final    URINE, RANDOM Performed at [facility], [address] [place], [place] [address]    Special Requests   Final    NONE Performed at [facility], [address] [place], [place] [address]    Culture   Final    NO GROWTH Performed at [facility], [address] [place], [place] [address]    Report Status 02/09/2019 FINAL  Final  SARS Coronavirus 2 (CEPHEID- Performed in [facility] hospital lab), Hosp Order     Status: None   Collection Time: 02/06/2019  1:14 PM   Specimen: Nasopharyngeal Swab  Result Value Ref Range Status   SARS Coronavirus 2 NEGATIVE NEGATIVE Final    Comment: (NOTE) If result is NEGATIVE SARS-CoV-2 target nucleic acids are NOT DETECTED. The SARS-CoV-2 RNA is generally detectable in upper and lower  respiratory specimens during the acute phase of infection. The lowest  concentration of SARS-CoV-2 viral copies this assay can detect is 250  copies / mL. A negative result does not preclude SARS-CoV-2 infection  and should not be used as the sole basis for treatment or other  patient management decisions.  A negative result may occur with  improper specimen collection / handling, submission of specimen other  than nasopharyngeal swab, presence of viral mutation(s) within the  areas targeted by this assay, and inadequate number of viral copies  (<250  copies / mL). A negative result must be combined with clinical  observations, patient history, and epidemiological information. If result is POSITIVE SARS-CoV-2 target nucleic acids are DETECTED. The SARS-CoV-2 RNA is generally detectable in upper and lower  respiratory specimens dur ing the acute phase of infection.  Positive  results are indicative of active infection with SARS-CoV-2.  Clinical  correlation with patient history and other diagnostic information is  necessary to determine patient infection status.  Positive results do  not rule out bacterial infection or co-infection with other viruses. If result is PRESUMPTIVE POSTIVE SARS-CoV-2 nucleic acids MAY BE PRESENT.   A presumptive positive result was obtained on the submitted specimen  and confirmed on repeat testing.  While 2019 novel coronavirus  (SARS-CoV-2) nucleic acids may be present in the submitted sample  additional confirmatory testing may be necessary for epidemiological  and / or clinical management purposes  to differentiate between  SARS-CoV-2 and other Sarbecovirus currently known to infect humans.  If clinically indicated additional testing with an alternate test  methodology [phone]([id number]) is advised. The SARS-CoV-2 RNA is generally  detectable in upper and lower respiratory sp ecimens during the acute  phase of infection. The expected result is Negative. Fact Sheet for Patients:  BoilerBrush.com.cyhttps://www.fda.gov/media/136312/download Fact Sheet for Healthcare Providers: https://pope.com/https://www.fda.gov/media/136313/download This test is not yet approved or cleared by the [place] FDA and has been authorized for detection and/or diagnosis of SARS-CoV-2 by FDA under an Emergency Use Authorization (EUA).  This EUA will remain in effect (meaning this test can be used) for the duration of the COVID-19 declaration under Section 564(b)(1) of the Act, 21 U.S.C. section 360bbb-3(b)(1), unless the authorization is terminated or revoked  sooner. Performed at [facility], [address] [place], [place] [address]   MRSA PCR Screening     Status: None   Collection Time: 02/11/19  1:03 PM   Specimen: Nasopharyngeal  Result Value Ref Range Status   MRSA by PCR NEGATIVE NEGATIVE Final    Comment:  The GeneXpert MRSA Assay (FDA approved for NASAL specimens only), is one component of a comprehensive MRSA colonization surveillance program. It is not intended to diagnose MRSA infection nor to guide or monitor treatment for MRSA infections. Performed at [facility], [address] [place], [place] [address]          Radiology Studies: No results found.      Scheduled Meds: . apixaban  5 mg Oral BID  . guaiFENesin  1,200 mg Oral BID  . haloperidol lactate  1 mg Intravenous Once  . insulin aspart  0-5 Units Subcutaneous QHS  . insulin aspart  0-9 Units Subcutaneous TID WC  . insulin aspart  10 Units Subcutaneous Once  . insulin detemir  10 Units Subcutaneous BID  . magnesium oxide  400 mg Oral QODAY  . metoprolol succinate  50 mg Oral Daily  . rosuvastatin  10 mg Oral QHS  . senna  1 tablet Oral BID  . sodium bicarbonate  650 mg Oral BID  . sodium chloride flush  10-40 mL Intracatheter Q12H  . vitamin B-12  1,000 mcg Oral Daily   Continuous Infusions: . ceFEPime (MAXIPIME) IV 2 g (02/16/19 0316)     LOS: 7 days    Time spent: 35 minutes    Ramiro Harvestaniel Olney Monier, MD [facility]  If 7PM-7AM, please contact night-coverage [email or web address] 02/16/2019, 12:30 PM

## 2019-02-16 NOTE — Care Management Important Message (Signed)
Important Message  Patient Details IM Letter given to Velva Harman RN to present to the Patient Name: Stephen Hester. MRN: [id number] Date of Birth: 05-18-1929   Medicare Important Message Given:  Yes     Kerin Salen 02/16/2019, 10:53 AM

## 2019-02-17 DIAGNOSIS — R21 Rash and other nonspecific skin eruption: Secondary | ICD-10-CM | POA: Diagnosis not present

## 2019-02-17 LAB — BASIC METABOLIC PANEL
Anion gap: 9 (ref 5–15)
BUN: 26 mg/dL — ABNORMAL HIGH (ref 8–23)
CO2: 18 mmol/L — ABNORMAL LOW (ref 22–32)
Calcium: 8.9 mg/dL (ref 8.9–10.3)
Chloride: 114 mmol/L — ABNORMAL HIGH (ref 98–111)
Creatinine, Ser: 1.45 mg/dL — ABNORMAL HIGH (ref 0.61–1.24)
GFR calc Af Amer: 50 mL/min — ABNORMAL LOW (ref >60)
GFR calc non Af Amer: 43 mL/min — ABNORMAL LOW (ref >60)
Glucose, Bld: 132 mg/dL — ABNORMAL HIGH (ref 70–99)
Potassium: 3.8 mmol/L (ref 3.5–5.1)
Sodium: 141 mmol/L (ref 135–145)

## 2019-02-17 LAB — GLUCOSE, CAPILLARY
Glucose-Capillary: 123 mg/dL — ABNORMAL HIGH (ref 70–99)
Glucose-Capillary: 129 mg/dL — ABNORMAL HIGH (ref 70–99)
Glucose-Capillary: 151 mg/dL — ABNORMAL HIGH (ref 70–99)
Glucose-Capillary: 215 mg/dL — ABNORMAL HIGH (ref 70–99)

## 2019-02-17 LAB — CBC
HCT: 32.3 % — ABNORMAL LOW (ref 39.0–52.0)
Hemoglobin: 10.4 g/dL — ABNORMAL LOW (ref 13.0–17.0)
MCH: 30.5 pg (ref 26.0–34.0)
MCHC: 32.2 g/dL (ref 30.0–36.0)
MCV: 94.7 fL (ref 80.0–100.0)
Platelets: 143 10*3/uL — ABNORMAL LOW (ref 150–400)
RBC: 3.41 MIL/uL — ABNORMAL LOW (ref 4.22–5.81)
RDW: 13 % (ref 11.5–15.5)
WBC: 5.4 10*3/uL (ref 4.0–10.5)
nRBC: 0 % (ref 0.0–0.2)

## 2019-02-17 LAB — FOLATE RBC
Folate, Hemolysate: 343 ng/mL
Folate, RBC: 1125 ng/mL (ref >498)
Hematocrit: 30.5 % — ABNORMAL LOW (ref 37.5–51.0)

## 2019-02-17 LAB — DIFFERENTIAL
Basophils Absolute: 0 10*3/uL (ref 0.0–0.1)
Basophils Relative: 0 %
Eosinophils Absolute: 0.3 10*3/uL (ref 0.0–0.5)
Eosinophils Relative: 5 %
Lymphocytes Relative: 15 %
Lymphs Abs: 0.9 10*3/uL (ref 0.7–4.0)
Monocytes Absolute: 0.6 10*3/uL (ref 0.1–1.0)
Monocytes Relative: 10 %
Neutro Abs: 3.9 10*3/uL (ref 1.7–7.7)
Neutrophils Relative %: 70 %

## 2019-02-17 LAB — NOVEL CORONAVIRUS, NAA (HOSP ORDER, SEND-OUT TO REF LAB; TAT 18-24 HRS): SARS-CoV-2, NAA: NOT DETECTED

## 2019-02-17 LAB — AMMONIA: Ammonia: 18 umol/L (ref 9–35)

## 2019-02-17 LAB — RPR: RPR Ser Ql: NONREACTIVE

## 2019-02-17 LAB — HIV ANTIBODY (ROUTINE TESTING W REFLEX): HIV Screen 4th Generation wRfx: NONREACTIVE

## 2019-02-17 MED ORDER — FAMOTIDINE IN NACL 20-0.9 MG/50ML-% IV SOLN
20.0000 mg | INTRAVENOUS | Status: DC
  Administered 2019-02-17: 20 mg via INTRAVENOUS
  Filled 2019-02-17: qty 50

## 2019-02-17 MED ORDER — LORAZEPAM 2 MG/ML IJ SOLN
1.0000 mg | Freq: Three times a day (TID) | INTRAMUSCULAR | Status: DC
  Administered 2019-02-17: 15:00:00 1 mg via INTRAVENOUS

## 2019-02-17 MED ORDER — LORAZEPAM 2 MG/ML IJ SOLN
1.0000 mg | Freq: Two times a day (BID) | INTRAMUSCULAR | Status: DC
  Administered 2019-02-18: 1 mg via INTRAVENOUS
  Filled 2019-02-17: qty 1

## 2019-02-17 MED ORDER — LORAZEPAM 2 MG/ML IJ SOLN
1.0000 mg | Freq: Once | INTRAMUSCULAR | Status: DC
  Filled 2019-02-17: qty 1

## 2019-02-17 MED ORDER — METHYLPREDNISOLONE SODIUM SUCC 125 MG IJ SOLR
60.0000 mg | Freq: Two times a day (BID) | INTRAMUSCULAR | Status: DC
  Administered 2019-02-17 – 2019-02-18 (×2): 60 mg via INTRAVENOUS
  Filled 2019-02-17 (×2): qty 2

## 2019-02-17 MED ORDER — SODIUM BICARBONATE 8.4 % IV SOLN
INTRAVENOUS | Status: DC
  Administered 2019-02-17 – 2019-02-18 (×2): via INTRAVENOUS
  Filled 2019-02-17 (×2): qty 150

## 2019-02-17 MED ORDER — DIPHENHYDRAMINE HCL 50 MG/ML IJ SOLN
12.5000 mg | Freq: Once | INTRAMUSCULAR | Status: AC
  Administered 2019-02-17: 16:00:00 12.5 mg via INTRAVENOUS
  Filled 2019-02-17: qty 1

## 2019-02-17 NOTE — Evaluation (Signed)
OT Cancellation Note  Patient Details Name: Stephen Hester. MRN: [id number] DOB: 01/10/1929   Cancelled Treatment:    Reason Eval/Treat Not Completed: Fatigue/lethargy limiting ability to participate  Will follow up next date.   Macario Golds 02/17/2019, 3:40 PM  Luanna Salk, [facility] SLP Acute Rehab Services Pager [phone] Office [phone]

## 2019-02-17 NOTE — Progress Notes (Signed)
PROGRESS NOTE    Stephen MuslimAlex Leak Jr.  WNU:[id number] DOB: Oct 16, 1928 DOA: November 01, 2018 [facility]    Brief Narrative:  HPI per Dr. Candelaria Stagersaye Hester on November 01, 2018 Stephen HatchetAlex Leak Hester a 85 y.o.malewith history ofdiabetes, dementia, hypertensionandA. fib on Eliquis presenting with unwitnessed fall and altered mental status.  Patient was not able to provide history due to encephalopathy/dementia.History based on chart review and discussion with EDP. Minimally follows command. Does not appear to be in distress. Trying to get out of the bed.Responds no to pain.  Called and talked to patient's daughter who had a call from facility saying he had unwitnessed fall.She was told his eyes were rolling backward. She did not have more detail. She confirms with me that he is full code.  Per EMS, not able to do stroke screen due to encephalopathy. Was given 500 cc normal saline bolus. Vital signs not impressive. CBG elevated to 378.  In ED, hemodynamically stable.100% on room air. Hgb 12.7. MCV 100. Platelet 124.Sodium 152. Bicarb 17. Glucose 235. BUN 60. Creatinine 2.99 (no baseline to compare to). Anion gap 16. Otherwise CMP not impressive. High-sensitivity troponin 36. EKG withsinus rhythm, PVCs, TW bilaterally and QTC to 580s. Urinalysis with glucose greater than 500. No ketones. Portable CXR, CT head and cervical spine without acute finding. COVID-19 negative. Was given normal saline bolus 1 L and hospitalist service was called for admission for encephalopathy and fall.   Assessment & Plan:   Active Problems:   Acute encephalopathy   AKI (acute kidney injury) (HCC)   Elevated troponin   Hypernatremia   Community acquired pneumonia   Paroxysmal A-fib (HCC)   QT prolongation   Hypokalemia  1 acute metabolic encephalopathy in the setting of underlying dementia Questionable etiology.  Likely secondary to suspected pneumonia and hypernatremia. Patient  admitted with mild DKA based on his labs and dehydration.  Neurological exam done on admission was limited due to patient's inability to cooperate as well as somnolence.  Patient more alert today and tracking however not speaking and not following commands.  Patient not participating with PT like he did a few days ago per nurse tech.  Patient not eating and refusing to open his mouth. Head CT and C-spine done with no acute abnormalities.  Repeat chest x-ray done concerning for bronchitis versus pneumonia.  Patient hydrated with IV fluids which were changed to D5W due to hypernatremia.  Patient being followed by speech and patient started on dysphagia 2 diet with thin liquids.  Patient refusing to open his mouth will have speech therapy reassess.  Patient seems to be regressing and as such MRI was obtained which was negative.  RBC folate RPR within normal limits.  Ammonia level slightly elevated and patient given lactulose.  Patient on gentle hydration.  Patient has completed a full course of antibiotics and as such we will discontinue IV cefepime.  Discussed with patient's daughter who states patient has white coat syndrome and refuses to speak when being seen by physician as he done before in the past.  ??  Catatonia.  Will place on Ativan 1 mg IV 3 times daily for the next 1 to 2 days to see if any significant improvement.  Follow.   2.  Left-sided pneumonia versus acute bronchitis, POA Patient on admission noted to be severely dehydrated and as such pneumonia fluffed out after hydration.  Continue empiric IV cefepime.  Continue flutter valve, incentive spirometry.  Procalcitonin level was less than 0.103.  Patient still with  some intermittent cough.  Urine Legionella antigen negative.  Urine pneumococcus antigen negative.  SARS-CoV-2 negative.  DC IV cefepime.  Continue supportive care.  Follow.   3.  Paroxysmal atrial fibrillation Continue metoprolol for rate control.  Eliquis for anticoagulation.  4.   Hypernatremia Likely secondary to hypovolemic hyponatremia in the setting of dehydration and dementia.  Sodium levels were 152 on admission.  Patient hydrated with IV fluids and initially also placed on D5W with improvement with hypernatremia.  Continue gentle hydration.    5.  Acute kidney injury/metabolic acidosis Likely secondary to prerenal azotemia as patient noted to be hyponatremic and dehydrated on admission.  Creatinine on admission was 2.99 with a BUN of 60.  Patient also noted to be on low-dose lisinopril and Aldactone which have been held during this hospitalization.  Renal ultrasound negative for hydronephrosis or obstructive changes noted.  Patient hydrated with IV fluids with improvement with renal function.  Creatinine fluctuating and currently at 1.45.  Place on gentle hydration with bicarb drip as patient refusing oral intake/poor oral intake.  Follow.  6.  Mild DKA/uncontrolled insulin-dependent diabetes mellitus type 2 Patient noted to have an anion gap of 16 with a bicarb of 17 and glucose of 236 early on in the hospitalization..  Patient noted to be on Levemir 27 units nightly.  Patient hydrated with IV fluids.  Hemoglobin A1c 9.6 on 02/09/2019.  CBG of 123 this morning.  Patient with poor oral intake.  Decreased Levemir to 10 units daily due to poor oral intake.  Continue sliding scale insulin.  Meal coverage insulin has been discontinued.  Follow.    7.  Hypertension Continue metoprolol.   8.  QTC prolongation QTC noted to be prolonged at 580.  Repeat EKG with QTC of 470.  Will resume patient's Zoloft when oral intake improves and tolerating.  Keep magnesium greater than 2.  Keep potassium greater than 4.  Follow.  9.  Elevated troponin Patient noted to have elevated troponin felt likely to be a demand ischemia secondary to dehydration and delayed clearance from acute kidney injury.  Patient with no baseline EKG to compare to however did have some T wave inversions on the  lateral leads.  High-sensitivity troponin trended down.  Patient with no overt chest pain.  Continue metoprolol and Eliquis.  Continue statin.  Follow.  10.  Normocytic anemia Likely drop in hemoglobin dilutional in nature.  Patient with no overt bleeding.  Follow H&H.  Transfusion threshold hemoglobin less than 7.  11.  Thrombocytopenia Stable.  Likely secondary to dehydration in the setting of pneumonia.  Patient with no overt bleeding.  May be in the setting of pneumonia.  Platelet count trending back up.  Follow.  12.  Hypokalemia Potassium at 3.8.  Follow.  13.  Rash Patient noted to have a diffuse rash on his back somewhat raised.  Concern for possible contact dermatitis versus drug rash.  Patient with 6 days of IV antibiotics and as such we will discontinue IV antibiotics.  IV Pepcid 20 mg twice daily, Solu-Medrol 60 mg IV every 12 hours.  IV Benadryl x1.  Follow.   DVT prophylaxis: Eliquis Code Status: Full Family Communication: Updated daughter via telephone. Disposition Plan: To be determined.   Consultants:   None  Procedures:  CT head CT C-spine 28-Feb-2019  Chest x-ray 2019-02-28, 02/11/2019, 02/12/2019, 02/13/2019  Renal ultrasound 02/13/2019   MRI brain 02/16/2019  Antimicrobials:   IV cefepime 02/11/2019 >>>>>> 02/17/2019   Subjective: Patient laying in  bed, alert, tracking however not speaking.  Patient follows some commands.  Patient with poor oral intake per RN and nurse tech.  Objective: Vitals:   02/16/19 0643 02/16/19 1324 02/16/19 1935 02/17/19 0509  BP: (!) 167/90 (!) 145/77 (!) 138/94 135/63  Pulse:  79 86 78  Resp:  14 18 18   Temp:  98.8 F (37.1 C) 97.7 F (36.5 C) (!) 97.4 F (36.3 C)  TempSrc:  Axillary Oral Oral  SpO2:  99% 98% 100%  Weight:      Height:        Intake/Output Summary (Last 24 hours) at 02/17/2019 1146 Last data filed at 02/17/2019 0347 Gross per 24 hour  Intake 0 ml  Output 500 ml  Net -500 ml   Filed Weights    02/09/19 1000  Weight: 78.2 kg    Examination:  General exam: Alert.  Dry mucous membranes. Respiratory system: CTAB.  No wheezes, no crackles, no rhonchi.  Normal respiratory effort.  Cardiovascular system: RRR no murmurs rubs or gallops.  No JVD.  No lower extremity edema.  Gastrointestinal system: Abdomen is soft, nontender, nondistended, positive bowel sounds.  No rebound.  No guarding.  Central nervous system: Alert.  Moving extremities spontaneously.  Extremities: Symmetric 5 x 5 power. Skin: Papular rash noted on back diffusely and on posterior right thigh. Psychiatry: Judgement and insight appear poor. Mood & affect appropriate.     Data Reviewed: I have personally reviewed following labs and imaging studies  CBC: Recent Labs  Lab 02/12/19 0613 02/13/19 0602 02/14/19 0627 02/15/19 0607 02/16/19 0652 02/17/19 0600  WBC 7.4 7.0 7.6 5.7 5.3 5.4  NEUTROABS 4.8 4.4 4.9 3.5 3.5  --   HGB 10.6* 10.2* 10.0* 10.1* 9.9* 10.4*  HCT 32.2* 30.7* 29.9* 31.2* 30.4* 32.3*  MCV 94.2 94.2 92.3 96.3 95.6 94.7  PLT 89* 83* 102* 115* 129* 143*   Basic Metabolic Panel: Recent Labs  Lab 02/11/19 0723 02/12/19 0613 02/13/19 0602 02/14/19 0627 02/15/19 0607 02/16/19 0652 02/17/19 0600  NA 139 138 138 135 140 139 141  K 4.4 4.3 4.0 4.1 3.9 4.0 3.8  CL 111 109 111 108 114* 112* 114*  CO2 19* 20* 19* 19* 19* 19* 18*  GLUCOSE 203* 239* 125* 167* 114* 82 132*  BUN 30* 31* 32* 35* 28* 24* 26*  CREATININE 1.49* 1.55* 1.67* 1.69* 1.39* 1.32* 1.45*  CALCIUM 8.6* 8.5* 8.4* 8.4* 8.8* 8.8* 8.9  MG 1.8 1.8 1.8 1.7 1.9  --   --   PHOS 2.8 2.7 2.8 2.9 2.7  --   --    GFR: Estimated Creatinine Clearance: 34.7 mL/min (A) (by C-G formula based on SCr of 1.45 mg/dL (H)). Liver Function Tests: Recent Labs  Lab 02/11/19 0723 02/12/19 0613 02/13/19 0602 02/14/19 0627 02/15/19 0607  AST 39 32 30 31 28   ALT 13 16 15 15 14   ALKPHOS 60 60 53 55 51  BILITOT 0.6 0.4 0.6 0.6 0.7  PROT 5.8*  5.7* 5.7* 5.9* 6.1*  ALBUMIN 3.3* 3.2* 3.3* 3.1* 3.3*   No results for input(s): LIPASE, AMYLASE in the last 168 hours. Recent Labs  Lab 02/16/19 1331 02/17/19 0600  AMMONIA 36* 18   Coagulation Profile: No results for input(s): INR, PROTIME in the last 168 hours. Cardiac Enzymes: No results for input(s): CKTOTAL, CKMB, CKMBINDEX, TROPONINI in the last 168 hours. BNP (last 3 results) No results for input(s): PROBNP in the last 8760 hours. HbA1C: No results for input(s): HGBA1C in the  last 72 hours. CBG: Recent Labs  Lab 02/16/19 1824 02/16/19 2100 02/17/19 0504 02/17/19 0731 02/17/19 1139  GLUCAP 76 110* 123* 129* 151*   Lipid Profile: No results for input(s): CHOL, HDL, LDLCALC, TRIG, CHOLHDL, LDLDIRECT in the last 72 hours. Thyroid Function Tests: No results for input(s): TSH, T4TOTAL, FREET4, T3FREE, THYROIDAB in the last 72 hours. Anemia Panel: No results for input(s): VITAMINB12, FOLATE, FERRITIN, TIBC, IRON, RETICCTPCT in the last 72 hours. Sepsis Labs: Recent Labs  Lab 02/11/19 1418 02/12/19 0613 02/13/19 0602  PROCALCITON <0.10 <0.10 <0.10    Recent Results (from the past 240 hour(s))  Urine culture     Status: None   Collection Time: March 01, 2019 12:29 PM   Specimen: Urine, Random  Result Value Ref Range Status   Specimen Description   Final    URINE, RANDOM Performed at [facility] [address] [place], [place] [address]    Special Requests   Final    NONE Performed at [facility], [place] [address], [place], [place] [address]    Culture   Final    NO GROWTH Performed at [facility], [place] [address], [place], [place] [address]    Report Status 02/09/2019 FINAL  Final  SARS Coronavirus 2 (CEPHEID- Performed in [place] hospital lab), Hosp Order     Status: None   Collection Time: 03-01-2019  1:14 PM   Specimen: Nasopharyngeal Swab  Result Value Ref Range Status   SARS Coronavirus 2 NEGATIVE NEGATIVE  Final    Comment: (NOTE) If result is NEGATIVE SARS-CoV-2 target nucleic acids are NOT DETECTED. The SARS-CoV-2 RNA is generally detectable in upper and lower  respiratory specimens during the acute phase of infection. The lowest  concentration of SARS-CoV-2 viral copies this assay can detect is 250  copies / mL. A negative result does not preclude SARS-CoV-2 infection  and should not be used as the sole basis for treatment or other  patient management decisions.  A negative result may occur with  improper specimen collection / handling, submission of specimen other  than nasopharyngeal swab, presence of viral mutation(s) within the  areas targeted by this assay, and inadequate number of viral copies  (<250 copies / mL). A negative result must be combined with clinical  observations, patient history, and epidemiological information. If result is POSITIVE SARS-CoV-2 target nucleic acids are DETECTED. The SARS-CoV-2 RNA is generally detectable in upper and lower  respiratory specimens dur ing the acute phase of infection.  Positive  results are indicative of active infection with SARS-CoV-2.  Clinical  correlation with patient history and other diagnostic information is  necessary to determine patient infection status.  Positive results do  not rule out bacterial infection or co-infection with other viruses. If result is PRESUMPTIVE POSTIVE SARS-CoV-2 nucleic acids MAY BE PRESENT.   A presumptive positive result was obtained on the submitted specimen  and confirmed on repeat testing.  While 2019 novel coronavirus  (SARS-CoV-2) nucleic acids may be present in the submitted sample  additional confirmatory testing may be necessary for epidemiological  and / or clinical management purposes  to differentiate between  SARS-CoV-2 and other Sarbecovirus currently known to infect humans.  If clinically indicated additional testing with an alternate test  methodology [phone]) is advised. The  SARS-CoV-2 RNA is generally  detectable in upper and lower respiratory sp ecimens during the acute  phase of infection. The expected result is Negative. Fact Sheet for Patients:  StrictlyIdeas.no Fact  Sheet for Healthcare Providers: https://pope.com/ This test is not yet approved or cleared by the [place] and has been authorized for detection and/or diagnosis of SARS-CoV-2 by FDA under an Emergency Use Authorization (EUA).  This EUA will remain in effect (meaning this test can be used) for the duration of the COVID-19 declaration under Section 564(b)(1) of the Act, 21 U.S.C. section 360bbb-3(b)(1), unless the authorization is terminated or revoked sooner. Performed at [facility], [address], [place], [place] [address]   MRSA PCR Screening     Status: None   Collection Time: 02/11/19  1:03 PM   Specimen: Nasopharyngeal  Result Value Ref Range Status   MRSA by PCR NEGATIVE NEGATIVE Final    Comment:        The GeneXpert MRSA Assay (FDA approved for NASAL specimens only), is one component of a comprehensive MRSA colonization surveillance program. It is not intended to diagnose MRSA infection nor to guide or monitor treatment for MRSA infections. Performed at [facility], [address] [place], [place] [address]   Novel Coronavirus, NAA (hospital order; send-out to ref lab)     Status: None   Collection Time: 02/15/19  5:26 PM   Specimen: Nasopharyngeal Swab; Respiratory  Result Value Ref Range Status   SARS-CoV-2, NAA NOT DETECTED NOT DETECTED Final    Comment: (NOTE) This test was developed and its performance characteristics determined by [employer]. This test has not been FDA cleared or approved. This test has been authorized by FDA under an Emergency Use Authorization (EUA). This test is only authorized for the duration of time the declaration that  circumstances exist justifying the authorization of the emergency use of in vitro diagnostic tests for detection of SARS-CoV-2 virus and/or diagnosis of COVID-19 infection under section 564(b)(1) of the Act, 21 U.S.C. 474QVZ-5(G)(3), unless the authorization is terminated or revoked sooner. When diagnostic testing is negative, the possibility of a false negative result should be considered in the context of a patient's recent exposures and the presence of clinical signs and symptoms consistent with COVID-19. An individual without symptoms of COVID-19 and who is not shedding SARS-CoV-2 virus would expect to have a negative (not detected) result in this assay. Performed  At: [facility] [address] [place], [place] [id number] Jolene Schimke MD JJ:[id number]    Coronavirus Source NASOPHARYNGEAL  Final    Comment: Performed at [facility], [address], [place], [place] [address]         Radiology Studies: Mr Brain Wo Contrast  Result Date: 02/16/2019 CLINICAL DATA:  Encephalopathy EXAM: MRI HEAD WITHOUT CONTRAST TECHNIQUE: Multiplanar, multiecho pulse sequences of the brain and surrounding structures were obtained without intravenous contrast. COMPARISON:  head CT 02/17/2019 FINDINGS: BRAIN: There is no acute infarct, acute hemorrhage or extra-axial collection. Diffuse confluent hyperintense T2-weighted signal within the periventricular, deep and juxtacortical white matter, most commonly due to chronic ischemic microangiopathy. There is generalized atrophy without lobar predilection. Susceptibility-sensitive sequences show no chronic microhemorrhage or superficial siderosis. The midline structures are normal. There is no midline shift or mass effect. 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 VASCULAR: The major intracranial arterial and venous sinus flow voids are normal. SKULL AND UPPER CERVICAL SPINE: Calvarial bone  marrow signal is normal. There is no skull base mass. The visualized upper cervical spine and soft tissues are normal. SINUSES/ORBITS: There are no fluid levels or advanced mucosal thickening. The mastoid air  cells and middle ear cavities are free of fluid. The orbits are normal. IMPRESSION: Chronic ischemic microangiopathy and generalized atrophy without acute abnormality. Electronically Signed   By: Deatra RobinsonKevin  Herman M.D.   On: 02/16/2019 21:27        Scheduled Meds:  apixaban  5 mg Oral BID   bisacodyl  10 mg Rectal Daily   guaiFENesin  1,200 mg Oral BID   haloperidol lactate  1 mg Intravenous Once   insulin aspart  0-5 Units Subcutaneous QHS   insulin aspart  0-9 Units Subcutaneous TID WC   insulin aspart  10 Units Subcutaneous Once   insulin detemir  10 Units Subcutaneous Daily   lactulose  20 g Oral Once   magnesium oxide  400 mg Oral QODAY   metoprolol succinate  50 mg Oral Daily   rosuvastatin  10 mg Oral QHS   senna  1 tablet Oral BID   sodium chloride flush  10-40 mL Intracatheter Q12H   vitamin B-12  1,000 mcg Oral Daily   Continuous Infusions:  ceFEPime (MAXIPIME) IV 2 g (02/17/19 0310)    sodium bicarbonate  infusion 1000 mL       LOS: 8 days    Time spent: 35 minutes    Ramiro Harvestaniel Taler Kushner, MD [facility]  If 7PM-7AM, please contact night-coverage [email or web address] 02/17/2019, 11:46 AM

## 2019-02-17 NOTE — Progress Notes (Signed)
PT Cancellation Note  Patient Details Name: Stephen Hester. MRN: [id number] DOB: 02-28-1929   Cancelled Treatment:     PT treatment canceled today. Pt  is not able to particiapate and  Is lethargic. Pt did open his eyes with cues, but would not verbalize and follow and commands to participate with therapy.    Lelon Mast 02/17/2019, 12:39 PM

## 2019-02-17 NOTE — Progress Notes (Signed)
OT Cancellation Note  Patient Details Name: Stephen Hester. MRN: [id number] DOB: 10-04-1928   Cancelled Treatment:    Reason Eval/Treat Not Completed: Fatigue/lethargy limiting ability to participate.  More lethargic per RN.  Serra Younan 02/17/2019, 3:28 PM  Lesle Chris, OTR/L Acute Rehabilitation Services [phone] [facility] pager [phone] office 02/17/2019

## 2019-02-17 NOTE — Progress Notes (Signed)
rm 1501, Stephen Hester. admitted fall,dementia, encephalopathy; MRI done 7/23. nurse observed Left arm swollen. Pt. able to move Left arm.

## 2019-02-18 ENCOUNTER — Inpatient Hospital Stay (HOSPITAL_COMMUNITY): Payer: Medicare PPO

## 2019-02-18 DIAGNOSIS — G9341 Metabolic encephalopathy: Secondary | ICD-10-CM

## 2019-02-18 DIAGNOSIS — Z515 Encounter for palliative care: Secondary | ICD-10-CM

## 2019-02-18 DIAGNOSIS — R569 Unspecified convulsions: Secondary | ICD-10-CM

## 2019-02-18 DIAGNOSIS — Z7189 Other specified counseling: Secondary | ICD-10-CM

## 2019-02-18 LAB — CBC WITH DIFFERENTIAL/PLATELET
Abs Immature Granulocytes: 0.02 10*3/uL (ref 0.00–0.07)
Basophils Absolute: 0 10*3/uL (ref 0.0–0.1)
Basophils Relative: 0 %
Eosinophils Absolute: 0 10*3/uL (ref 0.0–0.5)
Eosinophils Relative: 0 %
HCT: 29.6 % — ABNORMAL LOW (ref 39.0–52.0)
Hemoglobin: 9.6 g/dL — ABNORMAL LOW (ref 13.0–17.0)
Immature Granulocytes: 0 %
Lymphocytes Relative: 10 %
Lymphs Abs: 0.6 10*3/uL — ABNORMAL LOW (ref 0.7–4.0)
MCH: 30.5 pg (ref 26.0–34.0)
MCHC: 32.4 g/dL (ref 30.0–36.0)
MCV: 94 fL (ref 80.0–100.0)
Monocytes Absolute: 0.1 10*3/uL (ref 0.1–1.0)
Monocytes Relative: 2 %
Neutro Abs: 5.5 10*3/uL (ref 1.7–7.7)
Neutrophils Relative %: 88 %
Platelets: 161 10*3/uL (ref 150–400)
RBC: 3.15 MIL/uL — ABNORMAL LOW (ref 4.22–5.81)
RDW: 12.9 % (ref 11.5–15.5)
WBC: 6.3 10*3/uL (ref 4.0–10.5)
nRBC: 0 % (ref 0.0–0.2)

## 2019-02-18 LAB — BASIC METABOLIC PANEL
Anion gap: 9 (ref 5–15)
BUN: 33 mg/dL — ABNORMAL HIGH (ref 8–23)
CO2: 23 mmol/L (ref 22–32)
Calcium: 8.7 mg/dL — ABNORMAL LOW (ref 8.9–10.3)
Chloride: 110 mmol/L (ref 98–111)
Creatinine, Ser: 1.73 mg/dL — ABNORMAL HIGH (ref 0.61–1.24)
GFR calc Af Amer: 40 mL/min — ABNORMAL LOW (ref >60)
GFR calc non Af Amer: 35 mL/min — ABNORMAL LOW (ref >60)
Glucose, Bld: 331 mg/dL — ABNORMAL HIGH (ref 70–99)
Potassium: 3.9 mmol/L (ref 3.5–5.1)
Sodium: 142 mmol/L (ref 135–145)

## 2019-02-18 LAB — HEPARIN LEVEL (UNFRACTIONATED)
Heparin Unfractionated: 0.4 IU/mL (ref 0.30–0.70)
Heparin Unfractionated: 1.18 IU/mL — ABNORMAL HIGH (ref 0.30–0.70)

## 2019-02-18 LAB — CREATININE, URINE, RANDOM: Creatinine, Urine: 210.68 mg/dL

## 2019-02-18 LAB — LACTIC ACID, PLASMA: Lactic Acid, Venous: 1.6 mmol/L (ref 0.5–1.9)

## 2019-02-18 LAB — GLUCOSE, CAPILLARY
Glucose-Capillary: 281 mg/dL — ABNORMAL HIGH (ref 70–99)
Glucose-Capillary: 325 mg/dL — ABNORMAL HIGH (ref 70–99)
Glucose-Capillary: 289 mg/dL — ABNORMAL HIGH (ref 70–99)
Glucose-Capillary: 154 mg/dL — ABNORMAL HIGH (ref 70–99)

## 2019-02-18 LAB — URINALYSIS, ROUTINE W REFLEX MICROSCOPIC
Bilirubin Urine: NEGATIVE
Glucose, UA: >=500 mg/dL — AB
Ketones, ur: 5 mg/dL — AB
Leukocytes,Ua: NEGATIVE
Nitrite: NEGATIVE
Protein, ur: 100 mg/dL — AB
Specific Gravity, Urine: 1.02 (ref 1.005–1.030)
pH: 5 (ref 5.0–8.0)

## 2019-02-18 LAB — MAGNESIUM: Magnesium: 2 mg/dL (ref 1.7–2.4)

## 2019-02-18 LAB — APTT
aPTT: 24 seconds (ref 24–36)
aPTT: 120 seconds — ABNORMAL HIGH (ref 24–36)

## 2019-02-18 LAB — SODIUM, URINE, RANDOM: Sodium, Ur: 36 mmol/L

## 2019-02-18 MED ORDER — INSULIN DETEMIR 100 UNIT/ML ~~LOC~~ SOLN
14.0000 [IU] | Freq: Every day | SUBCUTANEOUS | Status: DC
  Administered 2019-02-18 – 2019-02-19 (×2): 14 [IU] via SUBCUTANEOUS
  Filled 2019-02-18 (×3): qty 0.14

## 2019-02-18 MED ORDER — LEVETIRACETAM IN NACL 1000 MG/100ML IV SOLN: 1000.0000 mg | Freq: Two times a day (BID) | INTRAVENOUS | Status: DC

## 2019-02-18 MED ORDER — HEPARIN (PORCINE) 25000 UT/250ML-% IV SOLN
950.0000 [IU]/h | INTRAVENOUS | Status: DC
  Administered 2019-02-18: 1100 [IU]/h via INTRAVENOUS
  Filled 2019-02-18: qty 250

## 2019-02-18 MED ORDER — LEVETIRACETAM IN NACL 500 MG/100ML IV SOLN
500.0000 mg | Freq: Two times a day (BID) | INTRAVENOUS | Status: DC
  Administered 2019-02-18 – 2019-02-22 (×9): 500 mg via INTRAVENOUS
  Filled 2019-02-18 (×11): qty 100

## 2019-02-18 MED ORDER — SODIUM CHLORIDE 0.9 % IV SOLN
INTRAVENOUS | Status: DC
  Administered 2019-02-18 – 2019-02-19 (×3): via INTRAVENOUS

## 2019-02-18 MED ORDER — SODIUM BICARBONATE 8.4 % IV SOLN
INTRAVENOUS | Status: DC
  Administered 2019-02-18: 12:00:00 via INTRAVENOUS
  Filled 2019-02-18 (×2): qty 150

## 2019-02-18 MED ORDER — METHYLPREDNISOLONE SODIUM SUCC 40 MG IJ SOLR: 40.0000 mg | Freq: Two times a day (BID) | INTRAMUSCULAR | Status: DC

## 2019-02-18 MED ORDER — LEVETIRACETAM IN NACL 1000 MG/100ML IV SOLN
1000.0000 mg | Freq: Once | INTRAVENOUS | Status: AC
  Administered 2019-02-18: 12:00:00 1000 mg via INTRAVENOUS
  Filled 2019-02-18: qty 100

## 2019-02-18 MED ORDER — LORAZEPAM 2 MG/ML IJ SOLN
1.0000 mg | INTRAMUSCULAR | Status: DC | PRN
  Administered 2019-02-22: 1 mg via INTRAVENOUS
  Filled 2019-02-18 (×2): qty 1

## 2019-02-18 NOTE — Progress Notes (Signed)
Stephen Hester for Stephen Hester --> heparin Indication: hx atrial fibrillation  No Known Allergies  Patient Measurements: Height: 5\' 8"  (172.7 cm) Weight: 172 lb 4.8 oz (78.2 kg) IBW/kg (Calculated) : 68.4 Heparin Dosing Weight: 78kg  Vital Signs: Temp: 97.6 F (36.4 C) (07/25 0609) BP: 136/82 (07/25 0609) Pulse Rate: 82 (07/25 0609)  Labs: Recent Labs    02/16/19 0652 02/16/19 1331 02/17/19 0600 02/18/19 0627  HGB 9.9*  --  10.4* 9.6*  HCT 30.4* 30.5* 32.3* 29.6*  PLT 129*  --  143* 161  CREATININE 1.32*  --  1.45* 1.73*    Estimated Creatinine Clearance: 29.1 mL/min (A) (by C-G formula based on SCr of 1.73 mg/dL (H)).   Medications:  - on Stephen Hester 5 mg bid PTA  Assessment: Patient's an 85 y.o M wit hx dementia and Afib on Stephen Hester PTA, presented to the ED on 7/15 with AMS s/p fall.  Stephen Hester was resumed on admission. Patient had suspected seizure activity on 7/24.  To transition patient to heparin drip on 7/25 d/t AMS and inability to take oral meds.  Today, 02/18/2019: - hgb relatively stable - plts trending up - scr trending up (1.73), currently on sodium bicarb drip - last dose of Stephen Hester 5 mg taken at 11p on 7/23  Goal of Therapy:  Heparin level 0.3-0.7 units/ml aPTT 66-102 seconds Monitor platelets by anticoagulation protocol: Yes   Plan:  - heparin drip at 1100 units/hr - baseline aPTT and heparin level now - check 8 he aPTT and heparin level after starting heparin drip - monitor for s/s bleeding  Stephen Hester P 02/18/2019,9:29 AM

## 2019-02-18 NOTE — Progress Notes (Signed)
SLP Cancellation Note  Patient Details Name: Stephen Hester. MRN: [id number] DOB: May 15, 1929   Cancelled treatment:         Patient is lethargic, change in mental status, MRI negative and being transferred to [facility] for continuous EEG. Will follow up at [facility] for BSE.   Charlynne Cousins Tifini Reeder, MA, CCC-SLP 02/18/2019 10:18 AM

## 2019-02-18 NOTE — Consult Note (Addendum)
Consultation Note Date: 02/18/2019   Patient Name: Stephen Muslimlex Leak Jr.  DOB: Feb 12, 1929  MRN: [id number]  Age / Sex: 85 y.o., male  PCP: [facility] Referring Physician: Rodolph Bonghompson, Daniel V, MD  Reason for Consultation: Establishing goals of care and Psychosocial/spiritual support  HPI/Patient Profile: 85 y.o. male  with past medical history of dementia, diabetes, hypertension, atrial fib on anticoagulation admitted on 04/07/2019 from [facility] after sustaining an unknown witnessed fall.  He was found to be dehydrated, creatinine 2.99 on admission.  Patient was beginning to improve with plans beginning for discharge when on 02/17/2019 patient developed upper extremity treating as well as altered mental status.  [facility] to have a EEG and be admitted to neuro ICU  Consult ordered for goals of care  Clinical Assessment and Goals of Care: Patient seen, chart reviewed.  Patient is unable to participate in assessment as he is minimally responsive.  He does not open his eyes to voice and only minimally reacts to light touch.  No upper extremity twitching noted during my assessment but he has been started on anticonvulsants and as noted, is undergoing EEG  Spoke to 1 of patient's 3 daughters, Stephen Hester who is also healthcare power of attorney.  She states she and her 2 other sisters are all making decisions together as a family.  Introduced palliative medicine services as an additional resource and source of support with the focus on patient comfort and goals.  Engaged in brief life review.  Patient apparently has a big farm and [address] [place], [place] where he resided up until 2019.  At that point [name] shares that he began to have mini strokes was having trouble managing his medications and she and her sisters  decided [place] was [place] for [place].  He lived with 1 of his daughters until she had to return to work and at that point they made provisions for him to reside at [facility]. He has coped well at [facility].  [name] states at baseline he can walk with a walker, is conversant, knows his family, physicians, wants updates on the farm, etc. When I discussed previous medical history of dementia, [name] seemed very surprised about this however she does acknowledge that he was unable to live in the home alone because of trouble with IADL's.  [facility] [place]  Patient at this point is unable to participate in goals of care discussion secondary to altered mental status.  His healthcare proxy, is his daughter, Stephen Hester at [phone]    SUMMARY OF RECOMMENDATIONS   Confirmed that patient is full scope of treatment, full code Palliative medicine to continue to support patient and family, and address goals of care as we see how he does clinically Code Status/Advance Care Planning:  Full code    Symptom Management:   Symptom management as directed by neurology and CCM  Palliative Prophylaxis:  Aspiration, Bowel Regimen, Delirium Protocol, Eye Care, Frequent Pain Assessment, Oral Care and Turn Reposition  Additional Recommendations (Limitations, Scope, Preferences):  Full Scope Treatment  Psycho-social/Spiritual:   Desire for further Chaplaincy support:no   Prognosis:   Unable to determine  Discharge Planning: To Be Determined      Primary Diagnoses: Present on Admission: . Acute encephalopathy   I have reviewed the medical record, interviewed the patient and family, and examined the patient. The following aspects are pertinent.  History reviewed. No pertinent past medical history. Social History   Socioeconomic History  . Marital status: Widowed    Spouse name: Not on file  . Number of children: Not on file  .  Years of education: Not on file  . Highest education level: Not on file  Occupational History  . Not on file  Social Needs  . Financial resource strain: Not on file  . Food insecurity    Worry: Not on file    Inability: Not on file  . Transportation needs    Medical: Not on file    Non-medical: Not on file  Tobacco Use  . Smoking status: Never Smoker  . Smokeless tobacco: Never Used  Substance and Sexual Activity  . Alcohol use: Not on file  . Drug use: Not on file  . Sexual activity: Not on file  Lifestyle  . Physical activity    Days per week: Not on file    Minutes per session: Not on file  . Stress: Not on file  Relationships  . Social Musicianconnections    Talks on phone: Not on file    Gets together: Not on file    Attends religious service: Not on file    Active member of club or organization: Not on file    Attends meetings of clubs or organizations: Not on file    Relationship status: Not on file  Other Topics Concern  . Not on file  Social History Narrative  . Not on file   History reviewed. No pertinent family history. Scheduled Meds: . bisacodyl  10 mg Rectal Daily  . guaiFENesin  1,200 mg Oral BID  . insulin aspart  0-5 Units Subcutaneous QHS  . insulin aspart  0-9 Units Subcutaneous TID WC  . insulin aspart  10 Units Subcutaneous Once  . insulin detemir  14 Units Subcutaneous Daily  . lactulose  20 g Oral Once  . magnesium oxide  400 mg Oral QODAY  . metoprolol succinate  50 mg Oral Daily  . rosuvastatin  10 mg Oral QHS  . senna  1 tablet Oral BID  . sodium chloride flush  10-40 mL Intracatheter Q12H  . vitamin B-12  1,000 mcg Oral Daily   Continuous Infusions: . sodium chloride    . heparin 1,100 Units/hr (02/18/19 1300)   PRN Meds:.acetaminophen **OR** acetaminophen, ipratropium-albuterol, LORazepam, polyethylene glycol, sodium chloride flush Medications Prior to Admission:  Prior to Admission medications   Medication Sig Start Date End Date  Taking? Authorizing Provider  ELIQUIS 5 MG TABS tablet Take 5 mg by mouth 2 (two) times daily. 07/30/18  Yes [provider]  insulin aspart (NOVOLOG) 100 UNIT/ML injection Inject 5 Units into the skin 2 (two) times a day. with breakfast and dinner   Yes [provider]  insulin detemir (LEVEMIR) 100 UNIT/ML injection Inject 27 Units into the skin at bedtime.   Yes [provider]  ipratropium-albuterol (DUONEB) 0.5-2.5 (3) MG/3ML SOLN Take 3 mLs by nebulization every 8 (  eight) hours as needed (wheezing or shortness of breath).   Yes [provider]  lisinopril (ZESTRIL) 10 MG tablet Take 10 mg by mouth daily.   Yes [provider]  loperamide (IMODIUM) 2 MG capsule Take 2 mg by mouth every 6 (six) hours as needed for diarrhea or loose stools.   Yes [provider]  magnesium oxide (MAG-OX) 400 MG tablet Take 400 mg by mouth every other day.   Yes [provider]  Melatonin 3 MG TABS Take 6 mg by mouth at bedtime.  05/12/18  Yes [provider]  metoprolol succinate (TOPROL-XL) 100 MG 24 hr tablet Take 50 mg by mouth daily. Take with or immediately following a meal.   Yes [provider]  rosuvastatin (CRESTOR) 10 MG tablet Take 10 mg by mouth at bedtime.   Yes [provider]  sertraline (ZOLOFT) 25 MG tablet Take 25 mg by mouth at bedtime.   Yes [provider]  spironolactone (ALDACTONE) 25 MG tablet Take 12.5 mg by mouth daily.   Yes [provider]  vitamin B-12 (CYANOCOBALAMIN) 1000 MCG tablet Take 1,000 mcg by mouth daily.   Yes [provider]   No Known Allergies Review of Systems  Unable to perform ROS: Acuity of condition    Physical Exam Vitals signs and nursing note reviewed.  Constitutional:      Appearance: He is ill-appearing.     Comments: Ill-appearing elderly man; minimally responsive to touch seen in neuro ICU  HENT:     Head: Normocephalic and atraumatic.   Cardiovascular:     Rate and Rhythm: Tachycardia present.  Pulmonary:     Effort: Pulmonary effort is normal.  Skin:    General: Skin is warm and dry.  Neurological:     Comments: No current upper extremity movements noted.  Patient is undergoing EEG currently and is on anticonvulsants He is unresponsive to voice and minimally responsive light touch  Psychiatric:     Comments: Unable to test     Vital Signs: BP 123/71 (BP Location: Left Arm)   Pulse 76   Temp 98.8 F (37.1 C) (Axillary)   Resp 19   Ht 5\' 8"  (1.727 m)   Wt 78.2 kg   SpO2 100%   BMI 26.20 kg/m  Pain Scale: PAINAD POSS *See Group Information*: S-Acceptable,Sleep, easy to arouse Pain Score: 3    SpO2: SpO2: 100 % O2 Device:SpO2: 100 % O2 Flow Rate: .   IO: Intake/output summary:   Intake/Output Summary (Last 24 hours) at 02/18/2019 1648 Last data filed at 02/18/2019 1600 Gross per 24 hour  Intake 1972.88 ml  Output 300 ml  Net 1672.88 ml    LBM: Last BM Date: 02/14/19 Baseline Weight: Weight: 78.2 kg Most recent weight: Weight: 78.2 kg     Palliative Assessment/Data:   Flowsheet Rows     Most Recent Value  Intake Tab  Referral Department  Hospitalist  Unit at Time of Referral  ICU  Palliative Care Primary Diagnosis  Neurology  Date Notified  02/16/19  Palliative Care Type  New Palliative care  Reason for referral  Psychosocial or Spiritual support, Clarify Goals of Care  Date of Admission  01/30/2019  Date first seen by Palliative Care  02/18/19  # of days Palliative referral response time  2 Day(s)  # of days IP prior to Palliative referral  8  Clinical Assessment  Palliative Performance Scale Score  30%  Pain Max last 24 hours  Not able to report  Pain Min Last 24 hours  Not able to report  Dyspnea Max Last 24 Hours  Not able to report  Dyspnea Min Last 24 hours  Not able to report  Nausea Max Last 24 Hours  Not able to report  Nausea Min Last 24 Hours  Not able to report  Anxiety Max  Last 24 Hours  Not able to report  Anxiety Min Last 24 Hours  Not able to report  Other Max Last 24 Hours  Not able to report  Psychosocial & Spiritual Assessment  Palliative Care Outcomes  Palliative Care follow-up planned  Yes, Facility      Time In: 1600 Time Out: 1710 Time Total: 70 min Greater than 50%  of this time was spent counseling and coordinating care related to the above assessment and plan.  Signed by: Dory Horn, NP   Please contact Palliative Medicine Team phone at [phone] for questions and concerns.  For individual provider: See Shea Evans

## 2019-02-18 NOTE — Progress Notes (Signed)
Pt transfer from [facility] ICU to [phone] he is non verbal has his eyes shut. BUE very stiff. BLE withdraw to pain. NPO total care condom cath. On seizure precaution. TEE monitor.This nurse notified his granddaughter Fredric Mare.

## 2019-02-18 NOTE — Progress Notes (Addendum)
PROGRESS NOTE    Stephen MuslimAlex Leak Jr.  ZOX:[id number] DOB: 1929/02/09 DOA: 02/17/2019 [facility]CP: [facility]    Brief Narrative:  HPI per Dr. Candelaria Stagersaye Hester on 01/30/2019 Stephen Hesteris a 85 y.o.malewith history ofdiabetes, dementia, hypertensionandA. fib on Eliquis presenting with unwitnessed fall and altered mental status.  Patient was not able to provide history due to encephalopathy/dementia.History based on chart review and discussion with EDP. Minimally follows command. Does not appear to be in distress. Trying to get out of the bed.Responds no to pain.  Called and talked to patient's daughter who had a call from facility saying he had unwitnessed fall.She was told his eyes were rolling backward. She did not have more detail. She confirms with me that he is full code.  Per EMS, not able to do stroke screen due to encephalopathy. Was given 500 cc normal saline bolus. Vital signs not impressive. CBG elevated to 378.  In ED, hemodynamically stable.100% on room air. Hgb 12.7. MCV 100. Platelet 124.Sodium 152. Bicarb 17. Glucose 235. BUN 60. Creatinine 2.99 (no baseline to compare to). Anion gap 16. Otherwise CMP not impressive. High-sensitivity troponin 36. EKG withsinus rhythm, PVCs, TW bilaterally and QTC to 580s. Urinalysis with glucose greater than 500. No ketones. Portable CXR, CT head and cervical spine without acute finding. COVID-19 negative. Was given normal saline bolus 1 L and hospitalist service was called for admission for encephalopathy and fall.   Assessment & Plan:   Active Problems:   Acute encephalopathy   AKI (acute kidney injury) (HCC)   Elevated troponin   Hypernatremia   Community acquired pneumonia   Paroxysmal A-fib (HCC)   QT prolongation   Hypokalemia   Rash of back  1 acute metabolic encephalopathy in the setting of underlying dementia Questionable etiology.  Concern for possible seizure activity as patient  noted per RN to have new onset left upper extremity tremors the evening of 02/17/2019.   Initially on presentation patient's acute metabolic encephalopathy was felt likely secondary to suspected pneumonia and hypernatremia. Patient admitted with mild DKA based on his labs and dehydration.  Neurological exam done on admission was limited due to patient's inability to cooperate as well as somnolence.  Patient was initially alert and tracking however was not speaking and not following commands over the past few days.  Patient not participating with PT like he did a few days ago per nurse tech.  Patient not eating and refusing to open his mouth.  Head CT and C-spine done with no acute abnormalities.   Repeat chest x-ray done concerning for bronchitis versus pneumonia.  Patient hydrated with IV fluids which were changed to D5W due to hypernatremia.  Hypernatremia now resolved.  Patient being followed by speech and patient started on dysphagia 2 diet with thin liquids.  Patient refusing to open his mouth will have speech therapy reassess.  Patient seems to be regressing and as such MRI was obtained which was negative.  RBC folate was 1125, RPR was nonreactive.  Ammonia level slightly elevated and patient given lactulose.  Patient on gentle hydration.  Patient has completed a full course of antibiotics and as such IV cefepime has been discontinued. Discussed with patient's daughter who stated patient has white coat syndrome and refuses to speak when being seen by physician as he done before in the past.  ??  Catatonia.  Patient placed on Ativan 1 mg IV twice daily starting 02/17/2019 to see if patient responded.   Overnight per RN patient noted  to have new left upper extremity tremors.  Patient responsive to noxious stimuli otherwise protecting his airway and seems to be somewhat comfortable.   Case discussed with neuro hospitalist due to concerns for possible seizure activity and recommendations were to transfer  [facility] and for EEG as this was not done [facility] on [facility] weekends.  Patient given Ativan 1 mg IV this morning per RN with some improvement with tremors for about 20 to 30 minutes.  Place on seizure precautions. We [facility] and management.    2.  Left-sided pneumonia versus acute bronchitis, POA Patient on admission noted to be severely dehydrated and as such pneumonia fluffed out after hydration.  Continue flutter valve, incentive spirometry.  Procalcitonin level was less than 0.103.  Patient with improvement with intermittent cough.  Urine Legionella antigen negative.  Urine pneumococcus antigen negative.  SARS-CoV-2 negative x2.  Status post 6 days IV cefepime.  Supportive care.  Follow.   3.  Paroxysmal atrial fibrillation Continue metoprolol for rate control.  Patient with poor oral intake and really not taking any medications orally and as such we will discontinue Eliquis and placed on IV heparin for now for anticoagulation.   4.  Hypernatremia Likely secondary to hypovolemic hyponatremia in the setting of dehydration and dementia.  Sodium levels were 152 on admission.  Patient hydrated with IV fluids and initially also placed on D5W with improvement with hypernatremia.  Sodium levels at 142 this morning.  Continue gentle hydration.    5.  Acute kidney injury/metabolic acidosis Likely secondary to prerenal azotemia as patient noted to be hyponatremic and dehydrated on admission.  Creatinine on admission was 2.99 with a BUN of 60.  Patient also noted to be on low-dose lisinopril and Aldactone which have been held during this hospitalization.  Renal ultrasound negative for hydronephrosis or obstructive changes noted.  Patient hydrated with IV fluids with improvement with renal function.  Creatinine fluctuating and slowly trending back up and currently at 1.73 with a BUN of 33 pointing more  towards a prerenal azotemia.  Increase IV fluids with bicarb drip to 100 cc/h.  Follow.  6.  Mild DKA/uncontrolled insulin-dependent diabetes mellitus type 2 Patient noted to have an anion gap of 16 with a bicarb of 17 and glucose of 236 early on in the hospitalization..  Patient noted to be on Levemir 27 units nightly.  Patient hydrated with IV fluids.  Hemoglobin A1c 9.6 on 02/09/2019.  CBG of 281 this morning, however patient was placed on IV steroids.  Patient with poor oral intake.  Increase Levemir to 14 units daily.  Patient with poor oral intake and as such we will need to monitor CBGs as steroids are tapered.  Continue sliding scale insulin.  Meal coverage insulin discontinued as patient with poor oral intake.  7.  Hypertension Continue metoprolol.   8.  QTC prolongation QTC noted to be prolonged at 580.  Repeat EKG with QTC of 470.  Will resume patient's Zoloft when oral intake improves and tolerating.  Keep magnesium greater than 2.  Keep potassium greater than 4.  Follow.  9.  Elevated troponin Patient noted to have elevated troponin felt likely to be a demand ischemia secondary to dehydration and delayed clearance from acute kidney injury.  Patient with no baseline EKG to compare to however did have some T wave inversions on the lateral leads.  High-sensitivity troponin trended down.  Patient with no overt chest pain.  Continue metoprolol and Eliquis.  Continue statin.  Follow.  10.  Normocytic anemia Likely drop in hemoglobin dilutional in nature.  Patient with no overt bleeding.  Hemoglobin currently at 9.6 and stable.  Transfusion threshold hemoglobin less than 7.   11.  Thrombocytopenia Improving.  Likely secondary to dehydration in the setting of pneumonia.  Patient with no overt bleeding.  May be in the setting of pneumonia.  Follow.  12.  Hypokalemia Potassium at 3.9.  Follow.  13.  Rash Patient noted to have a diffuse rash on his back somewhat raised.  Concern for possible  contact dermatitis versus drug rash.  Patient s/p 6 days of IV antibiotics, which were discontinued on 02/17/2019.  Slight improvement with rash.  Patient with normal platelet count.  Renal function somewhat stable however slight increase in creatinine felt secondary to be prerenal.  If worsening rash may need punch biopsy. Continue Solu-Medrol at 40 mg IV every 12 hours.  Continue IV Pepcid.  Follow.    DVT prophylaxis: Eliquis>>> Heparin Code Status: Full Family Communication: Tried calling daughter to update daughter via telephone however received voicemail.  Disposition Plan: Transfer to  [facility] neuro telemetry.    Consultants:   Neurology pending  Procedures:  CT head CT C-spine   Chest x-ray 02/20/2019, 02/11/2019, 02/12/2019, 02/13/2019  Renal ultrasound 02/13/2019   MRI brain 02/16/2019  EEG pending  Antimicrobials:   IV cefepime 02/11/2019 >>>>>> 02/17/2019   Subjective: Patient laying in bed, minimally responsive.  Responds to noxious stimuli with movement of the left upper extremity trying to push away.  Refusing to open eyes.  Per RN patient with new onset left upper extremity tremors overnight.   Objective: Vitals:   02/17/19 0509 02/17/19 1300 02/17/19 2036 02/18/19 0609  BP: 135/63 (!) 158/84 (!) 137/53 136/82  Pulse: 78 73 77 82  Resp: Temp: (!) 97.4 F (36.3 C) 98.7 F (37.1 C) 97.7 F (36.5 C) 97.6 F (36.4 C)  TempSrc: Oral Oral Oral   SpO2: 100% 100% 100% 99%  Weight:      Height:        Intake/Output Summary (Last 24 hours) at 02/18/2019 0903 Last data filed at 02/18/2019 0800 Gross per 24 hour  Intake 1592.42 ml  Output 300 ml  Net 1292.42 ml   Filed Weights   02/09/19 1000  Weight: 78.2 kg    Examination:  General exam: Minimally responsive.  Responding to noxious stimuli dry mucous membranes. Respiratory system: CTAB anterior lung fields.  No wheezes, no crackles, no rhonchi.  Normal respiratory effort.    Cardiovascular system: RRR no murmurs rubs or gallops.  No JVD.  No lower extremity edema.  Gastrointestinal system: Abdomen is soft, nontender, nondistended, positive bowel sounds.  No rebound.  No guarding.   Central nervous system: Responds to noxious stimuli.  Some left upper extremity tremor.  Moving extremities spontaneously.  Extremities: Symmetric 5 x 5 power. Skin: Macular papular rash noted on back diffusely and on posterior right thigh with slight improvement. Psychiatry: Unable to assess judgment and insight due to mental status.Mood & affect appropriate.     Data Reviewed: I have personally reviewed following labs and imaging studies  CBC: Recent Labs  Lab 02/14/19 0627 02/15/19 0607 02/16/19 0652 02/16/19 1331 02/17/19 0600 02/18/19 0627  WBC 7.6 5.7 5.3  --  5.4 6.3  NEUTROABS 4.9 3.5 3.5  --  3.9 5.5  HGB 10.0*  10.1* 9.9*  --  10.4* 9.6*  HCT 29.9* 31.2* 30.4* 30.5* 32.3* 29.6*  MCV 92.3 96.3 95.6  --  94.7 94.0  PLT 102* 115* 129*  --  143* 161   Basic Metabolic Panel: Recent Labs  Lab 02/12/19 0613 02/13/19 0602 02/14/19 0627 02/15/19 0607 02/16/19 0652 02/17/19 0600 02/18/19 0627  NA 138 138 135 140 139 141 142  K 4.3 4.0 4.1 3.9 4.0 3.8 3.9  CL 109 111 108 114* 112* 114* 110  CO2 20* 19* 19* 19* 19* 18* 23  GLUCOSE 239* 125* 167* 114* 82 132* 331*  BUN 31* 32* 35* 28* 24* 26* 33*  CREATININE 1.55* 1.67* 1.69* 1.39* 1.32* 1.45* 1.73*  CALCIUM 8.5* 8.4* 8.4* 8.8* 8.8* 8.9 8.7*  MG 1.8 1.8 1.7 1.9  --   --  2.0  PHOS 2.7 2.8 2.9 2.7  --   --   --    GFR: Estimated Creatinine Clearance: 29.1 mL/min (A) (by C-G formula based on SCr of 1.73 mg/dL (H)). Liver Function Tests: Recent Labs  Lab 02/12/19 0613 02/13/19 0602 02/14/19 0627 02/15/19 0607  AST 32 ALT ALKPHOS 60 53 55 51  BILITOT 0.4 0.6 0.6 0.7  PROT 5.7* 5.7* 5.9* 6.1*  ALBUMIN 3.2* 3.3* 3.1* 3.3*   No results for input(s): LIPASE, AMYLASE in the last 168  hours. Recent Labs  Lab 02/16/19 1331 02/17/19 0600  AMMONIA 36* 18   Coagulation Profile: No results for input(s): INR, PROTIME in the last 168 hours. Cardiac Enzymes: No results for input(s): CKTOTAL, CKMB, CKMBINDEX, TROPONINI in the last 168 hours. BNP (last 3 results) No results for input(s): PROBNP in the last 8760 hours. HbA1C: No results for input(s): HGBA1C in the last 72 hours. CBG: Recent Labs  Lab 02/17/19 0504 02/17/19 0731 02/17/19 1139 02/17/19 2042 02/18/19 0736  GLUCAP 123* 129* 151* 215* 281*   Lipid Profile: No results for input(s): CHOL, HDL, LDLCALC, TRIG, CHOLHDL, LDLDIRECT in the last 72 hours. Thyroid Function Tests: No results for input(s): TSH, T4TOTAL, FREET4, T3FREE, THYROIDAB in the last 72 hours. Anemia Panel: No results for input(s): VITAMINB12, FOLATE, FERRITIN, TIBC, IRON, RETICCTPCT in the last 72 hours. Sepsis Labs: Recent Labs  Lab 02/11/19 1418 02/12/19 0613 02/13/19 0602  PROCALCITON <0.10 <0.10 <0.10    Recent Results (from the past 240 hour(s))  Urine culture     Status: None   Collection Time: February 20, 2019 12:29 PM   Specimen: Urine, Random  Result Value Ref Range Status   Specimen Description   Final    URINE, RANDOM Performed at [facility], [address] [place], [place] [address]    Special Requests   Final    NONE Performed at [facility], [address] [place], [place] [address]    Culture   Final    NO GROWTH Performed at [facility], [address], [place], [place] [address]    Report Status 02/09/2019 FINAL  Final  SARS Coronavirus 2 (CEPHEID- Performed in [facility] hospital lab), Hosp Order     Status: None   Collection Time: 2019/02/20  1:14 PM   Specimen: Nasopharyngeal Swab  Result Value Ref Range Status   SARS Coronavirus 2 NEGATIVE NEGATIVE Final    Comment: (NOTE) If result is NEGATIVE SARS-CoV-2 target nucleic acids are NOT DETECTED. The  SARS-CoV-2 RNA is generally detectable in upper and lower  respiratory specimens during the  acute phase of infection. The lowest  concentration of SARS-CoV-2 viral copies this assay can detect is 250  copies / mL. A negative result does not preclude SARS-CoV-2 infection  and should not be used as the sole basis for treatment or other  patient management decisions.  A negative result may occur with  improper specimen collection / handling, submission of specimen other  than nasopharyngeal swab, presence of viral mutation(s) within the  areas targeted by this assay, and inadequate number of viral copies  (<250 copies / mL). A negative result must be combined with clinical  observations, patient history, and epidemiological information. If result is POSITIVE SARS-CoV-2 target nucleic acids are DETECTED. The SARS-CoV-2 RNA is generally detectable in upper and lower  respiratory specimens dur ing the acute phase of infection.  Positive  results are indicative of active infection with SARS-CoV-2.  Clinical  correlation with patient history and other diagnostic information is  necessary to determine patient infection status.  Positive results do  not rule out bacterial infection or co-infection with other viruses. If result is PRESUMPTIVE POSTIVE SARS-CoV-2 nucleic acids MAY BE PRESENT.   A presumptive positive result was obtained on the submitted specimen  and confirmed on repeat testing.  While 2019 novel coronavirus  (SARS-CoV-2) nucleic acids may be present in the submitted sample  additional confirmatory testing may be necessary for epidemiological  and / or clinical management purposes  to differentiate between  SARS-CoV-2 and other Sarbecovirus currently known to infect humans.  If clinically indicated additional testing with an alternate test  methodology [id number]) is advised. The SARS-CoV-2 RNA is generally  detectable in upper and lower respiratory sp ecimens during the acute    phase of infection. The expected result is Negative. Fact Sheet for Patients:  StrictlyIdeas.no Fact Sheet for Healthcare Providers: BankingDealers.co.za This test is not yet approved or cleared by the [place] FDA and has been authorized for detection and/or diagnosis of SARS-CoV-2 by FDA under an Emergency Use Authorization (EUA).  This EUA will remain in effect (meaning this test can be used) for the duration of the COVID-19 declaration under Section 564(b)(1) of the Act, 21 U.S.C. section 360bbb-3(b)(1), unless the authorization is terminated or revoked sooner. Performed at [facility], [place] [address], [place], [place] [address]   MRSA PCR Screening     Status: None   Collection Time: 02/11/19  1:03 PM   Specimen: Nasopharyngeal  Result Value Ref Range Status   MRSA by PCR NEGATIVE NEGATIVE Final    Comment:        The GeneXpert MRSA Assay (FDA approved for NASAL specimens only), is one component of a comprehensive MRSA colonization surveillance program. It is not intended to diagnose MRSA infection nor to guide or monitor treatment for MRSA infections. Performed at [facility], [place] [address] [place], [place] [address]   Novel Coronavirus, NAA (hospital order; send-out to ref lab)     Status: None   Collection Time: 02/15/19  5:26 PM   Specimen: Nasopharyngeal Swab; Respiratory  Result Value Ref Range Status   SARS-CoV-2, NAA NOT DETECTED NOT DETECTED Final    Comment: (NOTE) This test was developed and its performance characteristics determined by [employer]. This test has not been FDA cleared or approved. This test has been authorized by FDA under an Emergency Use Authorization (EUA). This test is only authorized for the duration of time the declaration that circumstances exist justifying the authorization of the emergency use of  in vitro diagnostic tests for  detection of SARS-CoV-2 virus and/or diagnosis of COVID-19 infection under section 564(b)(1) of the Act, 21 U.S.C. 657QIO-9(G)(2360bbb-3(b)(1), unless the authorization is terminated or revoked sooner. When diagnostic testing is negative, the possibility of a false negative result should be considered in the context of a patient's recent exposures and the presence of clinical signs and symptoms consistent with COVID-19. An individual without symptoms of COVID-19 and who is not shedding SARS-CoV-2 virus would expect to have a negative (not detected) result in this assay. Performed  At: [facility] [address] [place], [place] [id number] Jolene SchimkeNagendra Sanjai MD MW:[id number]:[phone]    Coronavirus Source NASOPHARYNGEAL  Final    Comment: Performed at [facility], [address] [place], [place] [address]         Radiology Studies: Mr Brain Wo Contrast  Result Date: 02/16/2019 CLINICAL DATA:  Encephalopathy EXAM: MRI HEAD WITHOUT CONTRAST TECHNIQUE: Multiplanar, multiecho pulse sequences of the brain and surrounding structures were obtained without intravenous contrast. COMPARISON:  head CT 02/10/2019 FINDINGS: BRAIN: There is no acute infarct, acute hemorrhage or extra-axial collection. Diffuse confluent hyperintense T2-weighted signal within the periventricular, deep and juxtacortical white matter, most commonly due to chronic ischemic microangiopathy. There is generalized atrophy without lobar predilection. Susceptibility-sensitive sequences show no chronic microhemorrhage or superficial siderosis. The midline structures are normal. There is no midline shift or mass effect. 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 VASCULAR: The major intracranial arterial and venous sinus flow voids are normal. SKULL AND UPPER CERVICAL SPINE: Calvarial bone marrow signal is normal. There is no skull base mass. The visualized upper cervical spine and soft tissues  are normal. SINUSES/ORBITS: There are no fluid levels or advanced mucosal thickening. The mastoid air cells and middle ear cavities are free of fluid. The orbits are normal. IMPRESSION: Chronic ischemic microangiopathy and generalized atrophy without acute abnormality. Electronically Signed   By: Deatra RobinsonKevin  Herman M.D.   On: 02/16/2019 21:27        Scheduled Meds:  apixaban  5 mg Oral BID   bisacodyl  10 mg Rectal Daily   guaiFENesin  1,200 mg Oral BID   haloperidol lactate  1 mg Intravenous Once   insulin aspart  0-5 Units Subcutaneous QHS   insulin aspart  0-9 Units Subcutaneous TID WC   insulin aspart  10 Units Subcutaneous Once   insulin detemir  14 Units Subcutaneous Daily   lactulose  20 g Oral Once   LORazepam  1 mg Intravenous BID   magnesium oxide  400 mg Oral QODAY   methylPREDNISolone (SOLU-MEDROL) injection  40 mg Intravenous Q12H   metoprolol succinate  50 mg Oral Daily   rosuvastatin  10 mg Oral QHS   senna  1 tablet Oral BID   sodium chloride flush  10-40 mL Intracatheter Q12H   vitamin B-12  1,000 mcg Oral Daily   Continuous Infusions:  famotidine (PEPCID) IV 20 mg (02/17/19 1623)    sodium bicarbonate  infusion 1000 mL       LOS: 9 days    Time spent: 40 minutes    Ramiro Harvestaniel Traevon Meiring, MD [facility]  If 7PM-7AM, please contact night-coverage [email or web address] 02/18/2019, 9:03 AM

## 2019-02-18 NOTE — Progress Notes (Signed)
Video call with granddaughter.

## 2019-02-18 NOTE — Progress Notes (Signed)
Pt presenting with new onset left upper extremity tremor. Pt unable to follow commands. MD paged, ativan given per order. Tremor now resolved. Will continue to monitor.

## 2019-02-18 NOTE — Progress Notes (Signed)
Pt to be transferred [place] room 22. Report called to [place], Therapist, sports. Pt to be transferred with all belongings. Daughter updated on pt status and change in location. Rapid RN and MD at bedside prior to transfer.

## 2019-02-18 NOTE — Significant Event (Signed)
Rapid Response Event Note  Overview: Time Called: 1030 Arrival Time: 1031 Event Type: Neurologic  Initial Focused Assessment: Pt resting in bed with tremors to upper extremities. Responsive to pain in all 4 extremities. Unable to follow commands. Forced gaze deviation to the left, unable to overcome upon initial assessment. Pupils equal 3 and sluggish. Ativan given earlier at [place] for seizure like activity.    Interventions: Bed Rails padded, suction and O2 set up at bedside. MD notified of patient condition. Ativan order changed from schedule BID to PRN Q4. No Keppra load, MD states Neuro wants to assess patient prior to keppra being given. By the end of encounter at 1100 patient continues to have tremors, still only responsive to pain and unable to follow commands. Left gaze deviation no longer present.  Plan of Care (if not transferred): Transfer orders in place for Tele Neuro at [facility] re-called to bedside at  1145. RN reports patient sat up and grimaced like in pain with bilat hands clenched. Did not respond to staff. Pt went pale and pulse ox read 50s and pulse read 0 (pulse oximetry was being measured on left finger while hand was clenched).  Upon arrival patient NSR 70s stable BP and pulse ox 100 % on room air. Pt resting in bed with rhythmic motion of right upper extremity. MD at bedside ordered Keppra load. Rapid carried out order. Rapid response remained at bedside until [facility] picked patient up to transfer to [facility].          Wray Kearns, RN

## 2019-02-18 NOTE — Progress Notes (Addendum)
Was called to bedside by RN as patient noted to have some irregular breathing noted some continued tremor activities.  Vital signs were stable.  Rapid response nurse at bedside.  There was some initial concerns for hypoxia however repeat vitals notes patient with sats of 100% on room air.  Patient noted with some right upper extremity tremors/jerking motion.  Patient not opening eyes however some response to noxious stimuli.  Moving extremities spontaneously.  Will transfer to the stepdown unit for now for closer monitoring while awaiting transfer to [facility] for further evaluation by neuro hospitalist and EEG monitoring.  Will load with Keppra 1 g IV x1.  Change Ativan to 1 mg IV every 2 hours as needed seizures.  Continue seizure precautions.  Follow.  No charge.   Addendum: 1440hours Updated daughter Abbey Chatters via telephone.

## 2019-02-18 NOTE — Progress Notes (Signed)
Pt with some irregular breathing and continued tremor activity. VS stable. Rapid called to assess pt. Will continue to monitor.

## 2019-02-18 NOTE — Progress Notes (Addendum)
EEG reviewed till 1657 and is suggestive of moderate encephalopathy. No seizures and epileptiform discharges were seen.   Stephen Hester Stephen Hester

## 2019-02-18 NOTE — Consult Note (Addendum)
NEUROLOGY CONSULT  Reason for Consult: Neurologic opinion for seizures Referring Physician: Dr Janee Mornhompson CC: none per pt  HPI: Stephen Muslimlex Leak Jr. is an 85 y.o. male who resides at SNF with history ofdiabetes, dementia, hypertensionandA. fib on Eliquis presenting with unwitnessed fall and altered mental status.Patient is not able to provide history due to encephalopathy/dementia, thus it is from EMR. He was given 1L IVNS and plan to admit for fall. Nursing staff noted irregular breathing and tremor type activity which stopped after given ativan. Few hours later this happened again and RRT was called to bedside for seizure activity that lasted few min. Pt was unable to follow commands, forced gaze to left noted during that time that then resolved after Ativan per RRT note. [facility].   Past Medical History History reviewed. No pertinent past medical history.  Past Surgical History History reviewed. No pertinent surgical history.  Family History History reviewed. No pertinent family history.  Social History    reports that he has never smoked. He has never used smokeless tobacco. No history on file for alcohol and drug.  Allergies No Known Allergies  Home Medications Medications Prior to Admission  Medication Sig Dispense Refill  . ELIQUIS 5 MG TABS tablet Take 5 mg by mouth 2 (two) times daily.    . insulin aspart (NOVOLOG) 100 UNIT/ML injection Inject 5 Units into the skin 2 (two) times a day. with breakfast and dinner    . insulin detemir (LEVEMIR) 100 UNIT/ML injection Inject 27 Units into the skin at bedtime.    Marland Kitchen. ipratropium-albuterol (DUONEB) 0.5-2.5 (3) MG/3ML SOLN Take 3 mLs by nebulization every 8 (eight) hours as needed (wheezing or shortness of breath).    Marland Kitchen. lisinopril (ZESTRIL) 10 MG tablet Take 10 mg by mouth daily.    Marland Kitchen. loperamide (IMODIUM) 2 MG capsule Take 2 mg by mouth every 6 (six) hours as needed for diarrhea or loose stools.    .  magnesium oxide (MAG-OX) 400 MG tablet Take 400 mg by mouth every other day.    . Melatonin 3 MG TABS Take 6 mg by mouth at bedtime.   0  . metoprolol succinate (TOPROL-XL) 100 MG 24 hr tablet Take 50 mg by mouth daily. Take with or immediately following a meal.    . rosuvastatin (CRESTOR) 10 MG tablet Take 10 mg by mouth at bedtime.    . sertraline (ZOLOFT) 25 MG tablet Take 25 mg by mouth at bedtime.    Marland Kitchen. spironolactone (ALDACTONE) 25 MG tablet Take 12.5 mg by mouth daily.    . vitamin B-12 (CYANOCOBALAMIN) 1000 MCG tablet Take 1,000 mcg by mouth daily.      Hospital Medications . bisacodyl  10 mg Rectal Daily  . guaiFENesin  1,200 mg Oral BID  . insulin aspart  0-5 Units Subcutaneous QHS  . insulin aspart  0-9 Units Subcutaneous TID WC  . insulin aspart  10 Units Subcutaneous Once  . insulin detemir  14 Units Subcutaneous Daily  . lactulose  20 g Oral Once  . magnesium oxide  400 mg Oral QODAY  . metoprolol succinate  50 mg Oral Daily  . rosuvastatin  10 mg Oral QHS  . senna  1 tablet Oral BID  . sodium chloride flush  10-40 mL Intracatheter Q12H  . vitamin B-12  1,000 mcg Oral Daily     ROS: Unable d/t encephalopathy   Physical Examination:  Vitals:   02/18/19 1245 02/18/19 1326 02/18/19  1340 02/18/19 1345  BP: 136/61 (!) 150/97 131/71 (!) 155/99  Pulse:  90 75 82  Resp:  14 19 (!) 24  Temp:  98.2 F (36.8 C)    TempSrc:  Axillary    SpO2:  97% 100% 98%  Weight:      Height:        General - elderly, no acute distress Heart - Regular rate and rhythm - no murmer Lungs - Clear to auscultation Abdomen - Soft - non tender Extremities - Distal pulses intact - no edema Skin - Warm and dry  Neurologic Examination:  Mental Status:  Did not open eyes, but grimaced and held them tightly closed during passive attempt to examine. Did not follow commands or attempt to speak; moaned to noxious stimuli Cranial Nerves:  PERRL, did not track, blinks to threat. Face appears  slightly asymmetric, but difficult to see as he is laying on right side and will not follow command to activate or reposition. Motor/Sensory: Withdraws to pain in LEs. Left arm again with marching type tremor seen, tone increased and rigid. Right arm localized to pain Deep Tendon Reflexes: 2/4 throughout Plantars: Downgoing bilaterally  Cerebellar: unable d/t lack of cooperation Gait: not tested   LABORATORY STUDIES:  Basic Metabolic Panel: Recent Labs  Lab 02/12/19 0613 02/13/19 0602 02/14/19 0627 02/15/19 0607 02/16/19 0652 02/17/19 0600 02/18/19 0627  NA 138 138 135 140 139 141 142  K 4.3 4.0 4.1 3.9 4.0 3.8 3.9  CL 109 111 108 114* 112* 114* 110  CO2 20* 19* 19* 19* 19* 18* 23  GLUCOSE 239* 125* 167* 114* 82 132* 331*  BUN 31* 32* 35* 28* 24* 26* 33*  CREATININE 1.55* 1.67* 1.69* 1.39* 1.32* 1.45* 1.73*  CALCIUM 8.5* 8.4* 8.4* 8.8* 8.8* 8.9 8.7*  MG 1.8 1.8 1.7 1.9  --   --  2.0  PHOS 2.7 2.8 2.9 2.7  --   --   --     Liver Function Tests: Recent Labs  Lab 02/12/19 0613 02/13/19 0602 02/14/19 0627 02/15/19 0607  AST 32 30 31 28   ALT 16 15 15 14   ALKPHOS 60 53 55 51  BILITOT 0.4 0.6 0.6 0.7  PROT 5.7* 5.7* 5.9* 6.1*  ALBUMIN 3.2* 3.3* 3.1* 3.3*   No results for input(s): LIPASE, AMYLASE in the last 168 hours. Recent Labs  Lab 02/16/19 1331 02/17/19 0600  AMMONIA 36* 18    CBC: Recent Labs  Lab 02/14/19 0627 02/15/19 0607 02/16/19 0652 02/16/19 1331 02/17/19 0600 02/18/19 0627  WBC 7.6 5.7 5.3  --  5.4 6.3  NEUTROABS 4.9 3.5 3.5  --  3.9 5.5  HGB 10.0* 10.1* 9.9*  --  10.4* 9.6*  HCT 29.9* 31.2* 30.4* 30.5* 32.3* 29.6*  MCV 92.3 96.3 95.6  --  94.7 94.0  PLT 102* 115* 129*  --  143* 161    Cardiac Enzymes: No results for input(s): CKTOTAL, CKMB, CKMBINDEX, TROPONINI in the last 168 hours.  BNP: Invalid input(s): POCBNP  CBG: Recent Labs  Lab 02/17/19 0731 02/17/19 1139 02/17/19 2042 02/18/19 0736 02/18/19 1126  GLUCAP 129* 151*  215* 281* 325*    Microbiology:   Coagulation Studies: No results for input(s): LABPROT, INR in the last 72 hours.  Urinalysis:  Recent Labs  Lab 02/18/19 0750  COLORURINE YELLOW  LABSPEC 1.020  PHURINE 5.0  GLUCOSEU >=500*  HGBUR MODERATE*  BILIRUBINUR NEGATIVE  KETONESUR 5*  PROTEINUR 100*  NITRITE NEGATIVE  LEUKOCYTESUR NEGATIVE  Lipid Panel:  No results found for: CHOL, TRIG, HDL, CHOLHDL, VLDL, LDLCALC  HgbA1C:  Lab Results  Component Value Date   HGBA1C 9.6 (H) 02/09/2019    Urine Drug Screen:  No results found for: LABOPIA, COCAINSCRNUR, LABBENZ, AMPHETMU, THCU, LABBARB   Alcohol Level:  No results for input(s): ETH in the last 168 hours.  IMAGING: Mr Brain Wo Contrast  Result Date: 02/16/2019 CLINICAL DATA:  Encephalopathy EXAM: MRI HEAD WITHOUT CONTRAST TECHNIQUE: Multiplanar, multiecho pulse sequences of the brain and surrounding structures were obtained without intravenous contrast. COMPARISON:  head CT Jan 10, 2019 FINDINGS: BRAIN: There is no acute infarct, acute hemorrhage or extra-axial collection. Diffuse confluent hyperintense T2-weighted signal within the periventricular, deep and juxtacortical white matter, most commonly due to chronic ischemic microangiopathy. There is generalized atrophy without lobar predilection. Susceptibility-sensitive sequences show no chronic microhemorrhage or superficial siderosis. The midline structures are normal. There is no midline shift or mass effect. 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 VASCULAR: The major intracranial arterial and venous sinus flow voids are normal. SKULL AND UPPER CERVICAL SPINE: Calvarial bone marrow signal is normal. There is no skull base mass. The visualized upper cervical spine and soft tissues are normal. SINUSES/ORBITS: There are no fluid levels or advanced mucosal thickening. The mastoid air cells and middle ear cavities are free of fluid. The orbits are  normal. IMPRESSION: Chronic ischemic microangiopathy and generalized atrophy without acute abnormality. Electronically Signed   By: Deatra RobinsonKevin  Herman M.D.   On: 02/16/2019 21:27   EEG: Abnormal, but no seizures or status on initial view  Assessment/Plan: This is a [age]yr old with unwitnessed fall at his SNF, found to be encephalopathic and not following commands. Multiple seizure like episodes [facility] and [facility]. 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   #Seizures- unclear etiology, but could be cefepime toxicity, toxic-metabolic, infection lowering seizure threshold, post stroke epilepsy # Encephalopathy- Multifactorial d/t post ictal effect, medication effect, infection, toxic-metabolic.  # UTI- Was being treated with Cefepime over last week; favor a different agent given the degree of possible neurotoxicity. Infection alone could have triggered seizure # Dementia/baseline debility- limits his neurologic reserve in setting of acute illness. His post ictal state or tox-metabolic encephalopathy may take longer than normal to improve.  # DM2- mild DKA and dehydration can contribute to encephalopathy as well.  # Afib- on Eliquis at SNF; currently not able to take po # HTN- normotensive goals. No new stroke seen on MRI  RECS: cEEG IV keppra 1000mg  BID NPO till more alert Consider nasoenteric feeding tube for meds/nutrition as this encephalopathy may last longer than 24h UA C&S pending Fall precautions Continue to treat underlying infections, metabolic issues Freq neuro cks We will follow with you   Attending neurologist's note to follow  Desiree Metzger-Cihelka, ARNP-C, ANVP-BC Pager: [phone]   NEUROHOSPITALIST ADDENDUM Performed a face to face diagnostic evaluation.   I have reviewed the contents of history and physical exam as documented by PA/ARNP/Resident and agree with above documentation.  I have  discussed and formulated the above plan as documented. Edits to the note have been made as needed.  85 year old patient with history of dementia admitted to the hospital for a fall and encephalopathy likely due to urinary tract infection and multiple metabolic disturbances.  Patient was treated with cefepime as well.  MRI brain was obtained which showed no acute findings.  Patient also has atrial fibrillation and is on heparin. He was then noted to have seizure-like activity.  Seizures were described as to have forced gaze deviation to the left with nystagmus, left arm and leg twitching.  Patient was transferred from [facility] to [facility] ER after receiving 1 g of Keppra and Ativan.  On arrival to [facility]: Nurses in the neuro ICU noted patient had one seizure like episode.  On my assessment patient was lethargic but was forcefully closes eyes and localizes in both upper extremities.  Patient was placed on continuous EEG monitoring to monitor for seizures, EEG shows triphasic waves and severe encephalopathy.  Recommendations  Continue EEG monitoring Keppra 1 g twice daily was started, will reduce to 500 mg twice daily for renal dosing Seizure precautions    Karena Addison Taheera Thomann MD [facility] [phone]   If 7pm to 7am, please call on call as listed on AMION.

## 2019-02-18 NOTE — Progress Notes (Signed)
[place] for Eliquis --> heparin Indication: hx atrial fibrillation  No Known Allergies  Patient Measurements: Height: 5\' 8"  (172.7 cm) Weight: 172 lb 4.8 oz (78.2 kg) IBW/kg (Calculated) : 68.4 Heparin Dosing Weight: 78kg  Vital Signs: Temp: 98.7 F (37.1 C) (07/25 2000) Temp Source: Axillary (07/25 2000) BP: 125/83 (07/25 2000) Pulse Rate: 77 (07/25 2000)  Labs: Recent Labs    02/16/19 5681 02/16/19 1331 02/17/19 0600 02/18/19 0627 02/18/19 1046 02/18/19 1943  HGB 9.9*  --  10.4* 9.6*  --   --   HCT 30.4* 30.5* 32.3* 29.6*  --   --   PLT 129*  --  143* 161  --   --   APTT  --   --   --   --  24 120*  HEPARINUNFRC  --   --   --   --  0.40 1.18*  CREATININE 1.32*  --  1.45* 1.73*  --   --     Estimated Creatinine Clearance: 29.1 mL/min (A) (by C-G formula based on SCr of 1.73 mg/dL (H)).   Medications:  - on Eliquis 5 mg bid PTA  Assessment: Patient's an 85 y.o M wit hx dementia and Afib on Eliquis PTA, presented to the ED on 7/15 with AMS s/p fall.  Eliquis was resumed on admission. Patient had suspected seizure activity on 7/24.  To transition patient to heparin drip on 7/25 d/t AMS and inability to take oral meds.  Heparin level this evening remains falsely elevated from recently Eliquis.  APTT also elevated.  Confirmed with RN, levels drawn from opposite arm from infusion.  No overt bleeding or complications noted.  Goal of Therapy:  Heparin level 0.3-0.7 units/ml aPTT 66-102 seconds Monitor platelets by anticoagulation protocol: Yes   Plan:  - Decrease IV heparin to 950 units/hr -Recheck heparin level and aPTT. -Daily aPTT and heparin level.  Marguerite Olea, [facility] Clinical Pharmacist Phone [phone]  02/18/2019 10:22 PM

## 2019-02-18 NOTE — Progress Notes (Signed)
vLTM EEG running. Notified Neuro 

## 2019-02-19 DIAGNOSIS — T827XXA Infection and inflammatory reaction due to other cardiac and vascular devices, implants and grafts, initial encounter: Secondary | ICD-10-CM

## 2019-02-19 DIAGNOSIS — E876 Hypokalemia: Secondary | ICD-10-CM

## 2019-02-19 DIAGNOSIS — Z7901 Long term (current) use of anticoagulants: Secondary | ICD-10-CM

## 2019-02-19 DIAGNOSIS — I10 Essential (primary) hypertension: Secondary | ICD-10-CM

## 2019-02-19 DIAGNOSIS — R4182 Altered mental status, unspecified: Secondary | ICD-10-CM

## 2019-02-19 DIAGNOSIS — D649 Anemia, unspecified: Secondary | ICD-10-CM

## 2019-02-19 DIAGNOSIS — G9340 Encephalopathy, unspecified: Secondary | ICD-10-CM

## 2019-02-19 DIAGNOSIS — F0280 Dementia in other diseases classified elsewhere without behavioral disturbance: Secondary | ICD-10-CM

## 2019-02-19 DIAGNOSIS — D696 Thrombocytopenia, unspecified: Secondary | ICD-10-CM | POA: Clinically undetermined

## 2019-02-19 DIAGNOSIS — R21 Rash and other nonspecific skin eruption: Secondary | ICD-10-CM

## 2019-02-19 DIAGNOSIS — I4811 Longstanding persistent atrial fibrillation: Secondary | ICD-10-CM

## 2019-02-19 DIAGNOSIS — R5381 Other malaise: Secondary | ICD-10-CM

## 2019-02-19 DIAGNOSIS — R569 Unspecified convulsions: Secondary | ICD-10-CM

## 2019-02-19 DIAGNOSIS — I480 Paroxysmal atrial fibrillation: Secondary | ICD-10-CM

## 2019-02-19 DIAGNOSIS — Z515 Encounter for palliative care: Secondary | ICD-10-CM

## 2019-02-19 DIAGNOSIS — G3183 Dementia with Lewy bodies: Secondary | ICD-10-CM

## 2019-02-19 LAB — URINE CULTURE: Culture: NO GROWTH

## 2019-02-19 LAB — CBC WITH DIFFERENTIAL/PLATELET
Abs Immature Granulocytes: 0.06 10*3/uL (ref 0.00–0.07)
Basophils Absolute: 0 10*3/uL (ref 0.0–0.1)
Basophils Relative: 0 %
Eosinophils Absolute: 0.1 10*3/uL (ref 0.0–0.5)
Eosinophils Relative: 1 %
HCT: 28.6 % — ABNORMAL LOW (ref 39.0–52.0)
Hemoglobin: 9.5 g/dL — ABNORMAL LOW (ref 13.0–17.0)
Immature Granulocytes: 1 %
Lymphocytes Relative: 14 %
Lymphs Abs: 1.6 10*3/uL (ref 0.7–4.0)
MCH: 30.4 pg (ref 26.0–34.0)
MCHC: 33.2 g/dL (ref 30.0–36.0)
MCV: 91.4 fL (ref 80.0–100.0)
Monocytes Absolute: 1 10*3/uL (ref 0.1–1.0)
Monocytes Relative: 8 %
Neutro Abs: 8.7 10*3/uL — ABNORMAL HIGH (ref 1.7–7.7)
Neutrophils Relative %: 76 %
Platelets: 163 10*3/uL (ref 150–400)
RBC: 3.13 MIL/uL — ABNORMAL LOW (ref 4.22–5.81)
RDW: 12.9 % (ref 11.5–15.5)
WBC: 11.4 10*3/uL — ABNORMAL HIGH (ref 4.0–10.5)
nRBC: 0 % (ref 0.0–0.2)

## 2019-02-19 LAB — BASIC METABOLIC PANEL
Anion gap: 12 (ref 5–15)
BUN: 34 mg/dL — ABNORMAL HIGH (ref 8–23)
CO2: 23 mmol/L (ref 22–32)
Calcium: 8.8 mg/dL — ABNORMAL LOW (ref 8.9–10.3)
Chloride: 112 mmol/L — ABNORMAL HIGH (ref 98–111)
Creatinine, Ser: 1.92 mg/dL — ABNORMAL HIGH (ref 0.61–1.24)
GFR calc Af Amer: 35 mL/min — ABNORMAL LOW (ref >60)
GFR calc non Af Amer: 31 mL/min — ABNORMAL LOW (ref >60)
Glucose, Bld: 154 mg/dL — ABNORMAL HIGH (ref 70–99)
Potassium: 3.7 mmol/L (ref 3.5–5.1)
Sodium: 147 mmol/L — ABNORMAL HIGH (ref 135–145)

## 2019-02-19 LAB — GLUCOSE, CAPILLARY
Glucose-Capillary: 153 mg/dL — ABNORMAL HIGH (ref 70–99)
Glucose-Capillary: 145 mg/dL — ABNORMAL HIGH (ref 70–99)
Glucose-Capillary: 115 mg/dL — ABNORMAL HIGH (ref 70–99)
Glucose-Capillary: 87 mg/dL (ref 70–99)
Glucose-Capillary: 71 mg/dL (ref 70–99)

## 2019-02-19 LAB — APTT
aPTT: 182 seconds (ref 24–36)
aPTT: 145 seconds — ABNORMAL HIGH (ref 24–36)

## 2019-02-19 LAB — HEPARIN LEVEL (UNFRACTIONATED)
Heparin Unfractionated: 1.76 IU/mL — ABNORMAL HIGH (ref 0.30–0.70)
Heparin Unfractionated: 1.38 IU/mL — ABNORMAL HIGH (ref 0.30–0.70)

## 2019-02-19 MED ORDER — HEPARIN (PORCINE) 25000 UT/250ML-% IV SOLN
750.0000 [IU]/h | INTRAVENOUS | Status: DC
  Administered 2019-02-19: 21:00:00 650 [IU]/h via INTRAVENOUS
  Administered 2019-02-21: 01:00:00 550 [IU]/h via INTRAVENOUS
  Filled 2019-02-19: qty 250

## 2019-02-19 MED ORDER — HEPARIN (PORCINE) 25000 UT/250ML-% IV SOLN
800.0000 [IU]/h | INTRAVENOUS | Status: DC
  Administered 2019-02-19: 800 [IU]/h via INTRAVENOUS
  Filled 2019-02-19: qty 250

## 2019-02-19 NOTE — Progress Notes (Signed)
[facility]  PROGRESS NOTE  Stephen MuslimAlex Leak Jr. ZOX:[id number] DOB: [birth year] DOA: 02/01/2019 PCP: [facility]  Brief History    Stephen Muslimlex Leak Jr. is a 85 y.o. year old male with medical history significant for diabetes, dementia, hypertensionandA. fib on Eliquis who presented on 02/15/2019 with unwitnessed fall and altered mental status at his facility and was found to have acute metabolic encephalopathy related to hyponatremia/AKI in setting of dehydration and pneumonia.  [facility] due to concern for seizure-like activity and worsening mental status.  Patient was loaded with Keppra and is monitoring on continuous EEG.  Transferred out of neuro ICU on 7/25.  A & P     Acute metabolic encephalopathy in the setting of underlying dementia, unclear etiology.  Initially on presentation encephalopathy related to suspected pneumonia and hyponatremia in the setting of dehydration/mild DKA.  Mental status worsened with concern for possible seizure-like activity.  Repeat metabolic work-up unremarkable (ammonia, RPR, folate wnl), MRI brain nonacute.  Continue continuous EEG monitoring, maintenance Keppra, neurology following, check CMP for resolution of hypernatremia/AKI, CBC, follow urine culture   Possible seizure-like activity.  Continues to have left arm clenched with some slight extremity tremors, patient nonverbal and nonresponsive does withdraw his lower extremities on exam.  Continue continues EEG monitoring, neurology following, status post Keppra load on maintenance    Left-sided pneumonia versus acute bronchitis, POA.  Patient is completed a total 6-day course of cefepime.   Paroxysmal atrial fibrillation remains rate controlled.  Continue Toprol, IV heparin given no p.o. intake.   Hypovolemic hypernatremia, in setting of dehydration/poor p.o. intake.  Patient related to needing aggressive pneumonia with diminished p.o. intake.  Sodium  improved from 152 on admission to 142 on 7/5 with 5W.  Will repeat BMP.     AKI with metabolic acidosis, suspect prerenal in the setting of diminished p.o. intake.  Creatinine 2.99/BUN 60 on admission, unclear baseline,.  Ultrasound with no hydro-.  Holding home lisinopril and Aldactone.  Check BMP, avoid nephrotoxins, monitor urine output, currently on maintenance fluids at 75 cc an hour   Type 2 diabetes, poorly controlled.  Presented in mild DKA.  A1c 9.6.  Closely monitor CBGs, on Levemir 14 units, sliding scale as needed   Elevated troponin, resolved.  Peak of 53 down trended to 45.  EKG nonischemic.  Presumed to be demand ischemia in setting of dehydration/AKI/pneumonia.   Normocytic anemia.  Remained stable in range of 9-10.  No reported blood loss.  Closely monitor.   Thrombocytopenia, resolved nadir of 89, currently 163 letter   Continue home Crestor   Diffuse rash on back.  Started on Solu-Medrol and Pepcid for presumed contact dermatitis versus drug rash with some slight improvement.  Continue to closely monitor.     DVT prophylaxis: IV heparin Code Status: Full code Family Communication: We will update daughter, [name] at [phone], H POA. Disposition Plan: Continues to need inpatient admission given close monitoring of mental status on continuous EEG to ensure no underlying seizure-like activity, continue maintenance antiseizure medications, continue to closely monitor for resolution of metabolic abnormalities    [facility] Direct contact: see [email or web address] (further directions at bottom of note if needed) 7PM-7AM contact night coverage as at bottom of note 02/19/2019, 8:49 AM  LOS: 10 days   Consultants  . Neurology, ICU, palliative  Procedures  . Continuous EEG  Antibiotics  IV cefepime 7/18-7/24 Interval History/Subjective  Transfer out of neuro ICU to the floor on  overnight on 7/25.  No acute events overnight.  Objective   Vitals:  Vitals:    02/19/19 0352 02/19/19 0802  BP: (!) 157/88 (!) 144/64  Pulse: 81 67  Resp: 16 13  Temp: 97.6 F (36.4 C) 97.7 F (36.5 C)  SpO2: 100% 100%    Exam:  Eyes closed, nonverbal, withdraws to noxious stimuli bilateral lower extremities, left arm clenched tightly to chest no withdrawal with noxious stimuli to bilateral Upper extremities.  For leftward gaze. Symmetrical Chest wall movement, Good air movement bilaterally, CTAB on anterior chest fail RRR,No Gallops,Rubs or new Murmurs, No Parasternal Heave +ve B.Sounds, Abd Soft, No rebound - guarding or rigidity. No Cyanosis, Clubbing or edema, No new Rash or bruise     I have personally reviewed the following:   Data Reviewed: Basic Metabolic Panel: Recent Labs  Lab 02/13/19 0602 02/14/19 0627 02/15/19 0607 02/16/19 0652 02/17/19 0600 02/18/19 0627  NA 138 135 140 139 141 142  K 4.0 4.1 3.9 4.0 3.8 3.9  CL 111 108 114* 112* 114* 110  CO2 19* 19* 19* 19* 18* 23  GLUCOSE 125* 167* 114* 82 132* 331*  BUN 32* 35* 28* 24* 26* 33*  CREATININE 1.67* 1.69* 1.39* 1.32* 1.45* 1.73*  CALCIUM 8.4* 8.4* 8.8* 8.8* 8.9 8.7*  MG 1.8 1.7 1.9  --   --  2.0  PHOS 2.8 2.9 2.7  --   --   --    Liver Function Tests: Recent Labs  Lab 02/13/19 0602 02/14/19 0627 02/15/19 0607  AST 30 31 28   ALT 15 15 14   ALKPHOS 53 55 51  BILITOT 0.6 0.6 0.7  PROT 5.7* 5.9* 6.1*  ALBUMIN 3.3* 3.1* 3.3*   No results for input(s): LIPASE, AMYLASE in the last 168 hours. Recent Labs  Lab 02/16/19 1331 02/17/19 0600  AMMONIA 36* 18   CBC: Recent Labs  Lab 02/14/19 0627 02/15/19 0607 02/16/19 0652 02/16/19 1331 02/17/19 0600 02/18/19 0627  WBC 7.6 5.7 5.3  --  5.4 6.3  NEUTROABS 4.9 3.5 3.5  --  3.9 5.5  HGB 10.0* 10.1* 9.9*  --  10.4* 9.6*  HCT 29.9* 31.2* 30.4* 30.5* 32.3* 29.6*  MCV 92.3 96.3 95.6  --  94.7 94.0  PLT 102* 115* 129*  --  143* 161   Cardiac Enzymes: No results for input(s): CKTOTAL, CKMB, CKMBINDEX, TROPONINI in the last  168 hours. BNP (last 3 results) No results for input(s): BNP in the last 8760 hours.  ProBNP (last 3 results) No results for input(s): PROBNP in the last 8760 hours.  CBG: Recent Labs  Lab 02/18/19 1126 02/18/19 1714 02/18/19 2213 02/19/19 0612 02/19/19 0809  GLUCAP 325* 289* 154* 153* 145*    Recent Results (from the past 240 hour(s))  MRSA PCR Screening     Status: None   Collection Time: 02/11/19  1:03 PM   Specimen: Nasopharyngeal  Result Value Ref Range Status   MRSA by PCR NEGATIVE NEGATIVE Final    Comment:        The GeneXpert MRSA Assay (FDA approved for NASAL specimens only), is one component of a comprehensive MRSA colonization surveillance program. It is not intended to diagnose MRSA infection nor to guide or monitor treatment for MRSA infections. Performed at [facility], [address] [place], [place] [id number]   Novel Coronavirus, NAA (hospital order; send-out to ref lab)     Status: None   Collection Time: 02/15/19  5:26 PM   Specimen:  Nasopharyngeal Swab; Respiratory  Result Value Ref Range Status   SARS-CoV-2, NAA NOT DETECTED NOT DETECTED Final    Comment: (NOTE) This test was developed and its performance characteristics determined by [employer]. This test has not been FDA cleared or approved. This test has been authorized by FDA under an Emergency Use Authorization (EUA). This test is only authorized for the duration of time the declaration that circumstances exist justifying the authorization of the emergency use of in vitro diagnostic tests for detection of SARS-CoV-2 virus and/or diagnosis of COVID-19 infection under section 564(b)(1) of the Act, 21 U.S.C. 147WGN-5(A)(2), unless the authorization is terminated or revoked sooner. When diagnostic testing is negative, the possibility of a false negative result should be considered in the context of a patient's recent exposures and the presence of clinical signs  and symptoms consistent with COVID-19. An individual without symptoms of COVID-19 and who is not shedding SARS-CoV-2 virus would expect to have a negative (not detected) result in this assay. Performed  At: [facility] [place], [place] [id number] Rush Farmer MD ON:[id number]    [place]  Final    Comment: Performed at [facility] [address], [place], [place] [address]     Studies: No results found.  Scheduled Meds: . bisacodyl  10 mg Rectal Daily  . guaiFENesin  1,200 mg Oral BID  . insulin aspart  0-5 Units Subcutaneous QHS  . insulin aspart  0-9 Units Subcutaneous TID WC  . insulin aspart  10 Units Subcutaneous Once  . insulin detemir  14 Units Subcutaneous Daily  . lactulose  20 g Oral Once  . magnesium oxide  400 mg Oral QODAY  . metoprolol succinate  50 mg Oral Daily  . rosuvastatin  10 mg Oral QHS  . senna  1 tablet Oral BID  . sodium chloride flush  10-40 mL Intracatheter Q12H  . vitamin B-12  1,000 mcg Oral Daily   Continuous Infusions: . sodium chloride 75 mL/hr at 02/19/19 0634  . heparin 950 Units/hr (02/18/19 2228)  . levETIRAcetam 500 mg (02/18/19 2145)    Active Problems:   Acute encephalopathy   AKI (acute kidney injury) ([place])   Elevated troponin   Hypernatremia   Community acquired pneumonia   Paroxysmal A-fib (HCC)   QT prolongation   Hypokalemia   Rash of back   Goals of care, counseling/discussion   Palliative care by specialist      Desiree Hane  [facility]

## 2019-02-19 NOTE — Progress Notes (Signed)
Received call from Pharmacy with order to turn heparin drip off for 1 hour. Heparin drip stopped. Will continue to monitor. Lajoyce Corners, RN

## 2019-02-19 NOTE — Progress Notes (Signed)
SLP Cancellation Note  Patient Details Name: Stephen Hester. MRN: [id number] DOB: August 15, 1928   Cancelled treatment:       Reason Eval/Treat Not Completed: Patient's level of consciousness  D/W nurse and the patient is only responsive to pain in his lower extremities.  ST will follow up in attempt to complete swallowing evaluation when appropriate.    Shelly Flatten, MA, [facility] SLP [phone]  Lamar Sprinkles 02/19/2019, 8:43 AM

## 2019-02-19 NOTE — Progress Notes (Addendum)
Brief History [age]yr old man from SNF with baseline dementia, Afib on Eliquis who had an unwitnessed fall and then a period of AMS. He was [facility] where staff witnessed at least two seizure episodes lasting a few min followed by encephalopathic period. [facility]  Subjective EEG did not show status, Triphasic waves were noted, at times in runs, frequently when patient was awake or stimulated. He is still quite encephalopathic, but improved some  Past Medical History History reviewed. No pertinent past medical history.  Past Surgical History History reviewed. No pertinent surgical history.  Allergies No Known Allergies  Home Medications Medications Prior to Admission  Medication Sig Dispense Refill  . ELIQUIS 5 MG TABS tablet Take 5 mg by mouth 2 (two) times daily.    . insulin aspart (NOVOLOG) 100 UNIT/ML injection Inject 5 Units into the skin 2 (two) times a day. with breakfast and dinner    . insulin detemir (LEVEMIR) 100 UNIT/ML injection Inject 27 Units into the skin at bedtime.    Marland Kitchen. ipratropium-albuterol (DUONEB) 0.5-2.5 (3) MG/3ML SOLN Take 3 mLs by nebulization every 8 (eight) hours as needed (wheezing or shortness of breath).    Marland Kitchen. lisinopril (ZESTRIL) 10 MG tablet Take 10 mg by mouth daily.    Marland Kitchen. loperamide (IMODIUM) 2 MG capsule Take 2 mg by mouth every 6 (six) hours as needed for diarrhea or loose stools.    . magnesium oxide (MAG-OX) 400 MG tablet Take 400 mg by mouth every other day.    . Melatonin 3 MG TABS Take 6 mg by mouth at bedtime.   0  . metoprolol succinate (TOPROL-XL) 100 MG 24 hr tablet Take 50 mg by mouth daily. Take with or immediately following a meal.    . rosuvastatin (CRESTOR) 10 MG tablet Take 10 mg by mouth at bedtime.    . sertraline (ZOLOFT) 25 MG tablet Take 25 mg by mouth at bedtime.    Marland Kitchen. spironolactone (ALDACTONE) 25 MG tablet Take 12.5 mg by mouth daily.    . vitamin B-12 (CYANOCOBALAMIN) 1000 MCG tablet Take  1,000 mcg by mouth daily.      Hospital Medications . bisacodyl  10 mg Rectal Daily  . guaiFENesin  1,200 mg Oral BID  . insulin aspart  0-5 Units Subcutaneous QHS  . insulin aspart  0-9 Units Subcutaneous TID WC  . insulin aspart  10 Units Subcutaneous Once  . insulin detemir  14 Units Subcutaneous Daily  . lactulose  20 g Oral Once  . magnesium oxide  400 mg Oral QODAY  . metoprolol succinate  50 mg Oral Daily  . rosuvastatin  10 mg Oral QHS  . senna  1 tablet Oral BID  . sodium chloride flush  10-40 mL Intracatheter Q12H  . vitamin B-12  1,000 mcg Oral Daily     Objective  Intake/Output from previous day: 07/25 0701 - 07/26 0700 In: 3025 [I.V.:2833.5; IV Piggyback:191.5] Out: 500 [Urine:500] Intake/Output this shift: No intake/output data recorded. Nutritional status:  Diet Order            Diet NPO time specified  Diet effective now               Physical Exam -  Vitals:   02/19/19 0352 02/19/19 0802 02/19/19 1000 02/19/19 1200  BP: (!) 157/88 (!) 144/64 (!) 140/57 (!) 137/52  Pulse: 81 67 67 64  Resp: 16 13 14 10   Temp: 97.6 F (36.4 C)  97.7 F (36.5 C)    TempSrc: Axillary Axillary    SpO2: 100% 100% 100% 100%  Weight:      Height:       General - elderly, disheveled. but no acute distress Heart - Regular rate and rhythm - no murmer Lungs - Clear to auscultation Abdomen - Soft - non tender Extremities - Distal pulses intact - no edema Skin - Warm and dry  Neurologic Exam:   Mental Status:  Opens eye to repeated voice, tactile (no noxious stim needed). Tracks briefly. attempts to say his name, but speech is too dysarthric to make out. Seemed to squeeze hand on command, but difficult to get him to replicate this again Cranial Nerves:  PERRL, tracked briefly, blinks to threat. Face appears symmetric, but not well activated Motor/Sensory: Withdraws to pain in LEs. No further Left arm tremor seen, tone improved, not as rigid today. Localizes to  pain Deep Tendon Reflexes: 2/4 throughout Plantars: Downgoing bilaterally  Cerebellar: unable d/t lack of cooperation Gait: not tested  LABORATORY RESULTS:  Basic Metabolic Panel: Recent Labs  Lab 02/13/19 0602 02/14/19 0627 02/15/19 0607 02/16/19 0652 02/17/19 0600 02/18/19 0627 02/19/19 0812  NA 138 135 140 139 141 142 147*  K 4.0 4.1 3.9 4.0 3.8 3.9 3.7  CL 111 108 114* 112* 114* 110 112*  CO2 19* 19* 19* 19* 18* 23 23  GLUCOSE 125* 167* 114* 82 132* 331* 154*  BUN 32* 35* 28* 24* 26* 33* 34*  CREATININE 1.67* 1.69* 1.39* 1.32* 1.45* 1.73* 1.92*  CALCIUM 8.4* 8.4* 8.8* 8.8* 8.9 8.7* 8.8*  MG 1.8 1.7 1.9  --   --  2.0  --   PHOS 2.8 2.9 2.7  --   --   --   --     Liver Function Tests: Recent Labs  Lab 02/13/19 0602 02/14/19 0627 02/15/19 0607  AST 30 31 28   ALT 15 15 14   ALKPHOS 53 55 51  BILITOT 0.6 0.6 0.7  PROT 5.7* 5.9* 6.1*  ALBUMIN 3.3* 3.1* 3.3*   No results for input(s): LIPASE, AMYLASE in the last 168 hours. Recent Labs  Lab 02/16/19 1331 02/17/19 0600  AMMONIA 36* 18    CBC: Recent Labs  Lab 02/15/19 0607 02/16/19 0652 02/16/19 1331 02/17/19 0600 02/18/19 0627 02/19/19 0812  WBC 5.7 5.3  --  5.4 6.3 11.4*  NEUTROABS 3.5 3.5  --  3.9 5.5 8.7*  HGB 10.1* 9.9*  --  10.4* 9.6* 9.5*  HCT 31.2* 30.4* 30.5* 32.3* 29.6* 28.6*  MCV 96.3 95.6  --  94.7 94.0 91.4  PLT 115* 129*  --  143* 161 163    Cardiac Enzymes: No results for input(s): CKTOTAL, CKMB, CKMBINDEX, TROPONINI in the last 168 hours.  Lipid Panel: No results for input(s): CHOL, TRIG, HDL, CHOLHDL, VLDL, LDLCALC in the last 168 hours.  CBG: Recent Labs  Lab 02/18/19 1714 02/18/19 2213 02/19/19 0612 02/19/19 0809 02/19/19 1206  GLUCAP 289* 154* 153* 145* 115*    Microbiology:   Coagulation Studies: No results for input(s): LABPROT, INR in the last 72 hours.  Miscellaneous Labs:   IMAGING RESULTS No results found.   EEG: This study is suggestive of mild to  moderate encephalopathy. No seizures or epileptiform discharges were seen throughout the recording. Triphasic waves were also noted, at times in runs, frequently when patient was awake or stimulated.   Assessment/Plan: This is a [age]yr old with unwitnessed fall at his SNF, found to be encephalopathic  and not following commands. Multiple seizure like episodes witnessed by nursing staff at [facility] and pt was transported to [facility] for cEEG. 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   #Seizures- unclear etiology, but could be cefepime toxicity, toxic-metabolic, infection lowering seizure threshold, post stroke epilepsy # Encephalopathy- Multifactorial d/t post ictal effect, medication effect, infection, toxic-metabolic. Given baseline debility and dementia, he likely has poor neurologic reserve to quickly rebound from seizures or any illness # UTI- Cx now showing no new growth. He was being treated with Cefepime over last week; favor a different agent given the degree of possible neurotoxicity. Infection alone could have triggered seizure # Dementia/baseline debility- limits his neurologic reserve in setting of acute illness. His post ictal state or tox-metabolic encephalopathy may take longer than normal to improve.  # DM2- mild DKA and dehydration can contribute to encephalopathy as well.  # Afib- on Eliquis at SNF; currently not able to take po # HTN- normotensive goals. No new stroke seen on MRI  RECS: D/c cEEG IV keppra 1000mg  BID (may switch to Po when able) NPO till more alert Consider nasoenteric feeding tube for meds/nutrition  Fall precautions Continue to treat underlying infections, metabolic issues Freq neuro cks   Desiree Metzger-Cihelka, ARNP-C, ANVP-BC Pager: [phone]   NEUROHOSPITALIST ADDENDUM Performed a face to face diagnostic evaluation.   I have reviewed the contents of history and physical exam as documented by PA/ARNP/Resident and agree with above  documentation.  I have discussed and formulated the above plan as documented. Edits to the note have been made as needed.  Patient is still significantly lethargic.  I suspect this is due to underlying poor cognitive reserve with multiple metabolic conditions.  MRI brain negative for stroke, acute findings and shows generalized atrophy and chronic small vessel disease.  Seizure a possibility, patient had witnessed seizure-like activity per hospitalist and neuro ICU nurses.  .  24hr EEG showed no seizure and  just generalized slowing.   Impression is metabolic encephalopathy, delirium with likely underlying dementia.  Continue Keppra 1 g twice daily, if patient remains significantly encephalopathic can reduce to 500 mg twice daily. Continue to treat underlying infections metabolic conditions.     Neurology will be available as needed.   Karena Addison Eleah Lahaie MD [facility] [phone]   If 7pm to 7am, please call on call as listed on AMION.

## 2019-02-19 NOTE — Progress Notes (Signed)
[place] for Eliquis --> heparin Indication: hx atrial fibrillation  No Known Allergies  Patient Measurements: Height: 5\' 8"  (172.7 cm) Weight: 163 lb 12.8 oz (74.3 kg) IBW/kg (Calculated) : 68.4 Heparin Dosing Weight: 78kg  Vital Signs: Temp: 98 F (36.7 C) (07/26 1949) Temp Source: Oral (07/26 1949) BP: 147/60 (07/26 1949) Pulse Rate: 82 (07/26 1949)  Labs: Recent Labs    02/17/19 0600 02/18/19 0627  02/18/19 1943 02/19/19 0812 02/19/19 1913  HGB 10.4* 9.6*  --   --  9.5*  --   HCT 32.3* 29.6*  --   --  28.6*  --   PLT 143* 161  --   --  163  --   APTT  --   --    < > 120* 182* 145*  HEPARINUNFRC  --   --    < > 1.18* 1.76* 1.38*  CREATININE 1.45* 1.73*  --   --  1.92*  --    < > = values in this interval not displayed.    Estimated Creatinine Clearance: 26.2 mL/min (A) (by C-G formula based on SCr of 1.92 mg/dL (H)).   Medications:  - on Eliquis 5 mg bid PTA  Assessment: Patient's an 85 y.o M wit hx dementia and Afib on Eliquis PTA, presented to the ED on 7/15 with AMS s/p fall.  Eliquis was resumed on admission. Patient had suspected seizure activity on 7/24.  Transitioned patient to heparin drip on 7/25 d/t AMS and inability to take oral meds.  aPTT (145) remains supratherapeutic and Heparin level (1.38) remains falsely elevated from recent Eliquis. Pt's aPTT remains elevated despite recent decrease in heparin drip rate from 950 to 800 units/hr. Confirmed levels drawn from opposite arm from infusion.  No overt bleeding or complications noted per RN. Hg/HCT are low but stable. Plts WNLs.  Goal of Therapy:  Heparin level 0.3-0.7 units/ml aPTT 66-102 seconds Monitor platelets by anticoagulation protocol: Yes   Plan:  -Decrease IV heparin to 650 units/hr -Recheck heparin level and aPTT with AM labs -Daily aPTT and heparin level, CBC -Eliquis on hold  Sherren Kerns, PharmD PGY1 [place]  Resident [phone] 02/19/2019  8:32 PM  Please check [email or web address] for unit-specific pharmacy phone numbers.

## 2019-02-19 NOTE — Procedures (Addendum)
Patient Name: Stephen Hester.  MRN: [id number]  Epilepsy Attending: Lora Havens  Referring Physician/Provider: Dr Remi Haggard Aroor Duration: 02/18/2019 1431 to 02/19/2019 1703  Patient history: This is a 85yr old with unwitnessed fall at his SNF, found to be encephalopathic and not following commands. Multiple seizure like episodes witnessed by nursing staff at [facility] and pt was transported to [facility] for cEEG. 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   Level of alertness: awake and asleep  AEDs during EEG study: LEV  Technical aspects: This EEG study was done with scalp electrodes positioned according to the 10-20 International system of electrode placement. Electrical activity was acquired at a sampling rate of 500Hz  and reviewed with a high frequency filter of 70Hz  and a low frequency filter of 1Hz . EEG data were recorded continuously and digitally stored.   BACKGROUND ACTIVITY:      Slowing: Continuous generalized 3-5 Hz theta-delta slowing seen. Triphasic waves were also noted, at times in runs, frequently when patient was awake or stimulated.   OTHER EVENTS:    Event button was pushed on 02/18/2019 at 2139 for left arm twitching. Concomitant EEG did not show seizure before, during and after the event.  SLEEP RECORDINGS:  Vertex wave and sleep spindles maximal in bilateral frontocentral region were noted   IMPRESSION: This study is suggestive of mild to moderate encephalopathy. No seizures or epileptiform discharges were seen throughout the recording.  Mirelle Biskup Barbra Sarks

## 2019-02-19 NOTE — Progress Notes (Signed)
[place] for Eliquis --> heparin Indication: hx atrial fibrillation  No Known Allergies  Patient Measurements: Height: 5\' 8"  (172.7 cm) Weight: 163 lb 12.8 oz (74.3 kg) IBW/kg (Calculated) : 68.4 Heparin Dosing Weight: 78kg  Vital Signs: Temp: 97.7 F (36.5 C) (07/26 0802) Temp Source: Axillary (07/26 0802) BP: 144/64 (07/26 0802) Pulse Rate: 67 (07/26 0802)  Labs: Recent Labs    02/17/19 0600 02/18/19 0627 02/18/19 1046 02/18/19 1943 02/19/19 0812  HGB 10.4* 9.6*  --   --  9.5*  HCT 32.3* 29.6*  --   --  28.6*  PLT 143* 161  --   --  163  APTT  --   --  24 120* 182*  HEPARINUNFRC  --   --  0.40 1.18* 1.76*  CREATININE 1.45* 1.73*  --   --  1.92*    Estimated Creatinine Clearance: 26.2 mL/min (A) (by C-G formula based on SCr of 1.92 mg/dL (H)).   Medications:  - on Eliquis 5 mg bid PTA  Assessment: Patient's an 85 y.o M wit hx dementia and Afib on Eliquis PTA, presented to the ED on 7/15 with AMS s/p fall.  Eliquis was resumed on admission. Patient had suspected seizure activity on 7/24.  Transitioned patient to heparin drip on 7/25 d/t AMS and inability to take oral meds.  APTT (182) remains elevated and Heparin level (1.76) remains falsely elevated from recent Eliquis and despite recent decrease in heparin drip rate from 1100 to 950 units/hr. Confirmed levels drawn from opposite arm from infusion.  No overt bleeding or complications noted.  Goal of Therapy:  Heparin level 0.3-0.7 units/ml aPTT 66-102 seconds Monitor platelets by anticoagulation protocol: Yes   Plan:  -Hold heparin for 1 hr -Decrease IV heparin to 800 units/hr -Recheck 8h heparin level and aPTT -Daily aPTT and heparin level, CBC -Eliquis on hold  Richardine Service, PharmD PGY1 Pharmacy Resident Phone: [phone] 02/19/2019  9:56 AM  Please check [email or web address] for unit-specific pharmacy phone numbers.

## 2019-02-19 NOTE — Progress Notes (Signed)
vLTM EEG complete. No skin breakdown 

## 2019-02-20 DIAGNOSIS — F0280 Dementia in other diseases classified elsewhere without behavioral disturbance: Secondary | ICD-10-CM

## 2019-02-20 DIAGNOSIS — I10 Essential (primary) hypertension: Secondary | ICD-10-CM

## 2019-02-20 DIAGNOSIS — Z7901 Long term (current) use of anticoagulants: Secondary | ICD-10-CM

## 2019-02-20 DIAGNOSIS — I4811 Longstanding persistent atrial fibrillation: Secondary | ICD-10-CM

## 2019-02-20 DIAGNOSIS — R5381 Other malaise: Secondary | ICD-10-CM

## 2019-02-20 DIAGNOSIS — G3183 Dementia with Lewy bodies: Secondary | ICD-10-CM

## 2019-02-20 LAB — BASIC METABOLIC PANEL
Anion gap: 9 (ref 5–15)
BUN: 29 mg/dL — ABNORMAL HIGH (ref 8–23)
CO2: 25 mmol/L (ref 22–32)
Calcium: 8.5 mg/dL — ABNORMAL LOW (ref 8.9–10.3)
Chloride: 115 mmol/L — ABNORMAL HIGH (ref 98–111)
Creatinine, Ser: 1.75 mg/dL — ABNORMAL HIGH (ref 0.61–1.24)
GFR calc Af Amer: 40 mL/min — ABNORMAL LOW (ref >60)
GFR calc non Af Amer: 34 mL/min — ABNORMAL LOW (ref >60)
Glucose, Bld: 104 mg/dL — ABNORMAL HIGH (ref 70–99)
Potassium: 3.3 mmol/L — ABNORMAL LOW (ref 3.5–5.1)
Sodium: 149 mmol/L — ABNORMAL HIGH (ref 135–145)

## 2019-02-20 LAB — MAGNESIUM: Magnesium: 1.9 mg/dL (ref 1.7–2.4)

## 2019-02-20 LAB — HEPARIN LEVEL (UNFRACTIONATED): Heparin Unfractionated: 0.9 IU/mL — ABNORMAL HIGH (ref 0.30–0.70)

## 2019-02-20 LAB — CBC
HCT: 25.1 % — ABNORMAL LOW (ref 39.0–52.0)
Hemoglobin: 8.4 g/dL — ABNORMAL LOW (ref 13.0–17.0)
MCH: 31 pg (ref 26.0–34.0)
MCHC: 33.5 g/dL (ref 30.0–36.0)
MCV: 92.6 fL (ref 80.0–100.0)
Platelets: 145 10*3/uL — ABNORMAL LOW (ref 150–400)
RBC: 2.71 MIL/uL — ABNORMAL LOW (ref 4.22–5.81)
RDW: 13.2 % (ref 11.5–15.5)
WBC: 6.6 10*3/uL (ref 4.0–10.5)
nRBC: 0 % (ref 0.0–0.2)

## 2019-02-20 LAB — GLUCOSE, CAPILLARY
Glucose-Capillary: 76 mg/dL (ref 70–99)
Glucose-Capillary: 112 mg/dL — ABNORMAL HIGH (ref 70–99)
Glucose-Capillary: 149 mg/dL — ABNORMAL HIGH (ref 70–99)
Glucose-Capillary: 136 mg/dL — ABNORMAL HIGH (ref 70–99)

## 2019-02-20 LAB — APTT
aPTT: 122 seconds — ABNORMAL HIGH (ref 24–36)
aPTT: 70 seconds — ABNORMAL HIGH (ref 24–36)

## 2019-02-20 MED ORDER — METOPROLOL TARTRATE 5 MG/5ML IV SOLN: 5.0000 mg | Freq: Four times a day (QID) | INTRAVENOUS | Status: DC | PRN

## 2019-02-20 MED ORDER — POTASSIUM CHLORIDE 10 MEQ/100ML IV SOLN
10.0000 meq | INTRAVENOUS | Status: AC
  Administered 2019-02-20 (×4): 10 meq via INTRAVENOUS
  Filled 2019-02-20 (×4): qty 100

## 2019-02-20 MED ORDER — DEXTROSE-NACL 5-0.45 % IV SOLN
INTRAVENOUS | Status: AC
  Administered 2019-02-20 – 2019-02-21 (×2): via INTRAVENOUS

## 2019-02-20 NOTE — Progress Notes (Signed)
PT Cancellation Note  Patient Details Name: Stephen Hester. MRN: [id number] DOB: Jan 15, 1929   Cancelled Treatment:    Reason Eval/Treat Not Completed: Fatigue/lethargy limiting ability to participate.  Will reattempt at another time.   Ramond Dial 02/20/2019, 2:08 PM   Mee Hives, PT MS Acute Rehab Dept. Number: [place]

## 2019-02-20 NOTE — Evaluation (Signed)
Clinical/Bedside Swallow Evaluation Patient Details  Name: Stephen Muslimlex Leak Jr. MRN: [id number] Date of Birth: 03-05-1929  Today's Date: 02/20/2019 Time: SLP Start Time (ACUTE ONLY): 0931 SLP Stop Time (ACUTE ONLY): 0942 SLP Time Calculation (min) (ACUTE ONLY): 11 min  Past Medical History: History reviewed. No pertinent past medical history. Past Surgical History: History reviewed. No pertinent surgical history. HPI:  Stephen Muslimlex Leak Jr. is a 85 y.o. male with history of diabetes, dementia, hypertension and A. fib on Eliquis presenting with unwitnessed fall and altered mental status. BSE was completed earlier this admission on 7/15 with recommendation for Dys 2 diet and thin liquids given minimal dentition and cognitive impairment. Pt had a further decline in mentation with concern for seizure, [facility] swallow evaluation.   Assessment / Plan / Recommendation Clinical Impression  Pt is alert this morning but not communicating or following commands. Per prior BSE report, he was nonverbal at that time as well. He has reduced automaticity though, opening his mouth in anticipation of boluses but then with limited initiation of labial seal or lingual propulsion. He has R anterior spillage of secretions at baseline as well as with thin liquids during diagnostic trials. Whatever does not spill from his mouth is ultimately orally suctioned due to oral holding. He had mildly improved awareness as trials continued, but not enough for a diet to be safey initiated. Recommend that he remain NPO for now with additional SLP f/u to determine readiness to resume a PO diet.  SLP Visit Diagnosis: Dysphagia, oral phase (R13.11)    Aspiration Risk  Moderate aspiration risk;Risk for inadequate nutrition/hydration    Diet Recommendation NPO   Medication Administration: Via alternative means    Other  Recommendations Oral Care Recommendations: Oral care QID Other Recommendations: Have oral suction  available   Follow up Recommendations (tba)      Frequency and Duration min 2x/week  2 weeks       Prognosis Prognosis for Safe Diet Advancement: Fair Barriers to Reach Goals: Cognitive deficits      Swallow Study   General HPI: Stephen Muslimlex Leak Jr. is a 85 y.o. male with history of diabetes, dementia, hypertension and A. fib on Eliquis presenting with unwitnessed fall and altered mental status. BSE was completed earlier this admission on 7/15 with recommendation for Dys 2 diet and thin liquids given minimal dentition and cognitive impairment. Pt had a further decline in mentation with concern for seizure, [facility] swallow evaluation. Type of Study: Bedside Swallow Evaluation Previous Swallow Assessment: see HPI Diet Prior to this Study: NPO Temperature Spikes Noted: No Respiratory Status: Room air History of Recent Intubation: No Behavior/Cognition: Alert;Doesn't follow directions Oral Cavity Assessment: Dry Oral Care Completed by SLP: Yes Oral Cavity - Dentition: Missing dentition;Other (Comment)(2 lower teeth noted) Self-Feeding Abilities: Total assist Patient Positioning: Upright in bed Baseline Vocal Quality: Not observed Volitional Swallow: Unable to elicit    Oral/Motor/Sensory Function Overall Oral Motor/Sensory Function: (unable to formally assess due to mentation)   Ice Chips Ice chips: Not tested   Thin Liquid Thin Liquid: Impaired Presentation: Spoon Oral Phase Impairments: Reduced labial seal;Poor awareness of bolus Oral Phase Functional Implications: Right anterior spillage;Oral holding Pharyngeal  Phase Impairments: Unable to trigger swallow    Nectar Thick Nectar Thick Liquid: Not tested   Honey Thick Honey Thick Liquid: Not tested   Puree Puree: Not tested   Solid     Solid: Not tested      Stephen RiegerLaura  P Janya Hester 02/20/2019,9:50 AM  Pollyann Glen, M.A. [place] Acute Environmental education officer [phone] Office [phone]

## 2019-02-20 NOTE — Care Management Important Message (Signed)
Important Message  Patient Details  Name: Stephen Hester. MRN: [id number] Date of Birth: April 07, 1929   Medicare Important Message Given:  Yes     [name] 02/20/2019, 1:30 PM

## 2019-02-20 NOTE — Progress Notes (Signed)
[place] for Eliquis --> heparin Indication: hx atrial fibrillation  No Known Allergies  Patient Measurements: Height: 5\' 8"  (172.7 cm) Weight: 163 lb 12.8 oz (74.3 kg) IBW/kg (Calculated) : 68.4 Heparin Dosing Weight: 78kg  Vital Signs: Temp: 98.1 F (36.7 C) (07/26 2328) Temp Source: Oral (07/26 2328) BP: 167/83 (07/26 2328) Pulse Rate: 69 (07/26 2328)  Labs: Recent Labs    02/17/19 0600 02/18/19 0627  02/18/19 1943 02/19/19 0812 02/19/19 1913 02/20/19 0326  HGB 10.4* 9.6*  --   --  9.5*  --  8.4*  HCT 32.3* 29.6*  --   --  28.6*  --  25.1*  PLT 143* 161  --   --  163  --  145*  APTT  --   --    < > 120* 182* 145* 122*  HEPARINUNFRC  --   --    < > 1.18* 1.76* 1.38*  --   CREATININE 1.45* 1.73*  --   --  1.92*  --   --    < > = values in this interval not displayed.    Estimated Creatinine Clearance: 26.2 mL/min (A) (by C-G formula based on SCr of 1.92 mg/dL (H)).   Medications:  - on Eliquis 5 mg bid PTA  Assessment: Patient's an 85 y.o M wit hx dementia and Afib on Eliquis PTA, presented to the ED on 7/15 with AMS s/p fall.  Eliquis was resumed on admission. Patient had suspected seizure activity on 7/24.  Transitioned patient to heparin drip on 7/25 d/t AMS and inability to take oral meds.  Repeat aPTT remains above goal but is trending down to 122 seconds. CBC stable this morning, no issues per RN.   Goal of Therapy:  Heparin level 0.3-0.7 units/ml aPTT 66-102 seconds Monitor platelets by anticoagulation protocol: Yes   Plan:  -Decrease IV heparin to 500 units/hr -Recheck heparin level and aPTT with AM labs -Daily aPTT and heparin level, CBC   Stephen Hester, PharmD, BCPS Clinical Pharmacist Please check AMION for all [place] numbers 02/20/2019

## 2019-02-20 NOTE — Progress Notes (Signed)
Received phone call from a daughter who is not listed on contact list stating her sister received a call from someone a the hospital and told her sister that her dad was not doing well and she had to come see him.  Spoke to daughter Pamala Hurry regarding her father, as well as the phone call received from her sister and stated she would contact her sisters and relay her conversation she had with the Doctor a few hours ago

## 2019-02-20 NOTE — Progress Notes (Signed)
[place] for Eliquis --> heparin Indication: atrial fibrillation  No Known Allergies  Patient Measurements: Height: 5\' 8"  (172.7 cm) Weight: 163 lb 12.8 oz (74.3 kg) IBW/kg (Calculated) : 68.4 Heparin Dosing Weight: 78kg  Vital Signs: Temp: 98.2 F (36.8 C) (07/27 1400) Temp Source: Oral (07/27 1400) BP: 160/92 (07/27 1400)  Labs: Recent Labs    02/18/19 0627  02/19/19 0812 02/19/19 1913 02/20/19 0326 02/20/19 0947 02/20/19 1437  HGB 9.6*  --  9.5*  --  8.4*  --   --   HCT 29.6*  --  28.6*  --  25.1*  --   --   PLT 161  --  163  --  145*  --   --   APTT  --    < > 182* 145* 122*  --  70*  HEPARINUNFRC  --    < > 1.76* 1.38* 0.90*  --   --   CREATININE 1.73*  --  1.92*  --   --  1.75*  --    < > = values in this interval not displayed.    Estimated Creatinine Clearance: 28.8 mL/min (A) (by C-G formula based on SCr of 1.75 mg/dL (H)).   Medications:  Eliquis 5 mg bid PTA. Last dose was on 7/23.  Assessment: Patient is an 85 y.o M with hx of dementia and Afib on Eliquis PTA, presented to the ED on 7/15 with AMS s/p fall. Eliquis was resumed on admission. Patient had suspected seizure activity on 7/24. Transitioned patient to heparin drip on 7/25 d/t AMS and inability to take oral meds.  APTT this afternoon is therapeutic at 70 but is on the low end of the goal range and is rapidly trending down. Will continue to monitor aPTT until heparin levels are not falsely evevated from Eliquis PTA.   H&H trending down but no signs or symptoms of bleeding Platelets stable (range 143-163)  Goal of Therapy:  Heparin level 0.3-0.7 units/ml aPTT 66-102 seconds Monitor platelets by anticoagulation protocol: Yes   Plan:  - Increase heparin drip to 550 units/hr - aPTT and heparin level in 8 hours  - Monitor daily aPTT and heparin level, daily CBC - Monitor for signs and symptoms of bleeding   Kennon Holter, PharmD PGY1 Ambulatory  Care Pharmacy Resident [place] Phone: [phone] 02/20/2019

## 2019-02-20 NOTE — Progress Notes (Signed)
[facility]  PROGRESS NOTE  Melton Krebs. DDU:[id number] DOB: [birth year] DOA: 02/01/2019 PCP: [facility]  Brief History    Birch Farino. is a 85 y.o. year old male with medical history significant for diabetes, dementia, hypertensionandA. fib on Eliquis who presented on 02/13/2019 with unwitnessed fall and altered mental status at his facility and was found to have acute metabolic encephalopathy related to hyponatremia/AKI in setting of dehydration and pneumonia.  Patient was transferred to [facility] on 7/25 due to concern for seizure-like activity and worsening mental status.  Patient was loaded with Keppra and is monitoring on continuous EEG.  Transferred out of neuro ICU on 7/25.  A & P     Acute metabolic encephalopathy in the setting of underlying dementia, unclear etiology, slightly improving.  Very lethargic but more alert and able to track with his eyes this morning initially on presentation encephalopathy related to suspected pneumonia and hyponatremia in the setting of dehydration/mild DKA.  Mental status worsened with concern for possible seizure-like activity no obvious seizures on continuous EEG.  Repeat metabolic work-up unremarkable (ammonia, RPR, folate wnl), MRI brain nonacute.  Continue continuous EEG monitoring, maintenance Keppra, neurology following, still has elevated sodium, will correct with D5 half-normal saline maintenance fluids, BMP in a.m.   Possible seizure-like activity witnessed prior to transfer to [facility], no repeat activity found on continues EEG.  Neurology suspects this is delirium in setting of metabolic abnormalities above and patient with underlying dementia.  If remains encephalopathic can consider reducing Keppra to 500 mg twice daily   Left-sided pneumonia versus acute bronchitis, POA.  Patient icompleted a total 6-day course of cefepime.   Paroxysmal atrial fibrillation remains rate controlled.  Continue Toprol, IV heparin  given no p.o. intake.   Hypovolemic hypernatremia, in setting of dehydration/poor p.o. intake and patient with dementia and pneumonia.  Sodium improved from 152 on admission to 142 on 7/25 but BMP this a.m. shows sodium of 149, will restart D5 half-normal saline, will repeat BMP in a.m.  Currently n.p.o. given inability to follow commands, speech following to determine ultimate nutritional source   AKI with metabolic acidosis, suspect prerenal in the setting of diminished p.o. intake.  Creatinine 2.99/BUN 60 on admission, creatinine currently 1.75 unclear baseline,.  Ultrasound with no hydro-.  Holding home lisinopril and Aldactone.  Daily BMP, avoid nephrotoxins, monitor urine output, change maintenance fluids to D5 half-normal saline to correct hyponatremia as well   Type 2 diabetes, poorly controlled.  Presented in mild DKA.  A1c 9.6.  FBG at goal currently closely monitor CBGs, discontinue Levemir given currently n.p.o., sliding scale as needed   Elevated troponin, resolved.  Peak of 53 down trended to 45.  EKG nonischemic.  Presumed to be demand ischemia in setting of dehydration/AKI/pneumonia.   Normocytic anemia.  Remained stable in range of 9-10.  No reported blood loss.  Closely monitor.   Thrombocytopenia, resolved nadir of 89, currently 145, monitor   Continue home Crestor   Diffuse rash on back.  Started on Solu-Medrol and Pepcid for presumed contact dermatitis versus drug rash with some slight improvement prior to transfer.  Will hold on continuing here and closely monitor     DVT prophylaxis: IV heparin Code Status: Full code Family Communication: Will update daughter, Pamala Hurry at [phone], [place]. Disposition Plan: Continues to need inpatient admission given close monitoring of mental status hopeful for more winters to allow for oral intake, currently correcting hypernatremia in setting of diminished oral intake  related to encephalopathic mental status, continue  maintenance antiseizure medications, continue to closely monitor for resolution of metabolic abnormalities    [facility] Direct contact: see [email or web address] (further directions at bottom of note if needed) 7PM-7AM contact night coverage as at bottom of note 02/20/2019, 12:51 PM  LOS: 11 days   Consultants  . Neurology, ICU, palliative  Procedures  . Continuous EEG  Antibiotics  IV cefepime 7/18-7/24 Interval History/Subjective  No acute events overnight. Patient more alert this morning  Objective   Vitals:  Vitals:   02/20/19 0622 02/20/19 1200  BP: (!) 153/82 140/86  Pulse:    Resp: 10   Temp: 97.7 F (36.5 C)   SpO2: 99% 100%    Exam:  Opens eyes to voice, tracks, still nonverbal, no longer clutching upper extremities, will be left upper extremity on his own Still withdraws to noxious stimuli bilaterally in lower extremities Symmetrical Chest wall movement, Good air movement bilaterally, CTAB on anterior chest fail RRR,No Gallops,Rubs or new Murmurs, No Parasternal Heave +ve B.Sounds, Abd Soft, No rebound - guarding or rigidity. No Cyanosis, Clubbing or edema, No new Rash or bruise     I have personally reviewed the following:   Data Reviewed: Basic Metabolic Panel: Recent Labs  Lab 02/14/19 0627 02/15/19 0607 02/16/19 0652 02/17/19 0600 02/18/19 0627 02/19/19 0812 02/20/19 0947  NA 135 140 139 141 142 147* 149*  K 4.1 3.9 4.0 3.8 3.9 3.7 3.3*  CL 108 114* 112* 114* 110 112* 115*  CO2 19* 19* 19* 18* 23 23 25   GLUCOSE 167* 114* 82 132* 331* 154* 104*  BUN 35* 28* 24* 26* 33* 34* 29*  CREATININE 1.69* 1.39* 1.32* 1.45* 1.73* 1.92* 1.75*  CALCIUM 8.4* 8.8* 8.8* 8.9 8.7* 8.8* 8.5*  MG 1.7 1.9  --   --  2.0  --   --   PHOS 2.9 2.7  --   --   --   --   --    Liver Function Tests: Recent Labs  Lab 02/14/19 0627 02/15/19 0607  AST 31 28  ALT 15 14  ALKPHOS 55 51  BILITOT 0.6 0.7  PROT 5.9* 6.1*  ALBUMIN 3.1* 3.3*   No results for input(s):  LIPASE, AMYLASE in the last 168 hours. Recent Labs  Lab 02/16/19 1331 02/17/19 0600  AMMONIA 36* 18   CBC: Recent Labs  Lab 02/15/19 0607 02/16/19 0652 02/16/19 1331 02/17/19 0600 02/18/19 0627 02/19/19 0812 02/20/19 0326  WBC 5.7 5.3  --  5.4 6.3 11.4* 6.6  NEUTROABS 3.5 3.5  --  3.9 5.5 8.7*  --   HGB 10.1* 9.9*  --  10.4* 9.6* 9.5* 8.4*  HCT 31.2* 30.4* 30.5* 32.3* 29.6* 28.6* 25.1*  MCV 96.3 95.6  --  94.7 94.0 91.4 92.6  PLT 115* 129*  --  143* 161 163 145*   Cardiac Enzymes: No results for input(s): CKTOTAL, CKMB, CKMBINDEX, TROPONINI in the last 168 hours. BNP (last 3 results) No results for input(s): BNP in the last 8760 hours.  ProBNP (last 3 results) No results for input(s): PROBNP in the last 8760 hours.  CBG: Recent Labs  Lab 02/19/19 1206 02/19/19 1627 02/19/19 2132 02/20/19 0629 02/20/19 1055  GLUCAP 115* 87 71 76 112*    Recent Results (from the past 240 hour(s))  MRSA PCR Screening     Status: None   Collection Time: 02/11/19  1:03 PM   Specimen: Nasopharyngeal  Result Value Ref Range Status   MRSA by  PCR NEGATIVE NEGATIVE Final    Comment:        The GeneXpert MRSA Assay (FDA approved for NASAL specimens only), is one component of a comprehensive MRSA colonization surveillance program. It is not intended to diagnose MRSA infection nor to guide or monitor treatment for MRSA infections. Performed at [facility], [address] [place], [place] [address]   Novel Coronavirus, NAA (hospital order; send-out to ref lab)     Status: None   Collection Time: 02/15/19  5:26 PM   Specimen: Nasopharyngeal Swab; Respiratory  Result Value Ref Range Status   SARS-CoV-2, NAA NOT DETECTED NOT DETECTED Final    Comment: (NOTE) This test was developed and its performance characteristics determined by [facility]. This test has not been FDA cleared or approved. This test has been authorized by FDA under an Emergency Use  Authorization (EUA). This test is only authorized for the duration of time the declaration that circumstances exist justifying the authorization of the emergency use of in vitro diagnostic tests for detection of SARS-CoV-2 virus and/or diagnosis of COVID-19 infection under section 564(b)(1) of the Act, 21 U.S.C. 604VWU-9(W)(1360bbb-3(b)(1), unless the authorization is terminated or revoked sooner. When diagnostic testing is negative, the possibility of a false negative result should be considered in the context of a patient's recent exposures and the presence of clinical signs and symptoms consistent with COVID-19. An individual without symptoms of COVID-19 and who is not shedding SARS-CoV-2 virus would expect to have a negative (not detected) result in this assay. Performed  At: [facility] [address] [place], [place] [id number] Jolene SchimkeNagendra Sanjai MD AO:[id number]:[phone]    Coronavirus Source NASOPHARYNGEAL  Final    Comment: Performed at [facility], [address] [place], [place] [address]  Culture, Urine     Status: None   Collection Time: 02/18/19  7:50 AM   Specimen: Urine, Clean Catch  Result Value Ref Range Status   Specimen Description   Final    URINE, CLEAN CATCH Performed at [facility], [address] [place], [place] [address]    Special Requests   Final    NONE Performed at [facility], [address] [place], [place] [address]    Culture   Final    NO GROWTH Performed at [facility], [address] [place], [place] [address]    Report Status 02/19/2019 FINAL  Final     Studies: No results found.  Scheduled Meds: . bisacodyl  10 mg Rectal Daily  . guaiFENesin  1,200 mg Oral BID  . insulin aspart  0-5 Units Subcutaneous QHS  . insulin aspart  0-9 Units Subcutaneous TID WC  . magnesium oxide  400 mg Oral QODAY  . sodium chloride flush  10-40 mL Intracatheter Q12H   Continuous Infusions: .  dextrose 5 % and 0.45% NaCl 75 mL/hr at 02/20/19 0811  . heparin 500 Units/hr (02/20/19 0622)  . 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

## 2019-02-21 DIAGNOSIS — R400 Somnolence: Secondary | ICD-10-CM

## 2019-02-21 LAB — BASIC METABOLIC PANEL
Anion gap: 8 (ref 5–15)
Anion gap: 9 (ref 5–15)
BUN: 22 mg/dL (ref 8–23)
BUN: 22 mg/dL (ref 8–23)
CO2: 25 mmol/L (ref 22–32)
CO2: 25 mmol/L (ref 22–32)
Calcium: 8.4 mg/dL — ABNORMAL LOW (ref 8.9–10.3)
Calcium: 8.5 mg/dL — ABNORMAL LOW (ref 8.9–10.3)
Chloride: 109 mmol/L (ref 98–111)
Chloride: 109 mmol/L (ref 98–111)
Creatinine, Ser: 1.63 mg/dL — ABNORMAL HIGH (ref 0.61–1.24)
Creatinine, Ser: 1.68 mg/dL — ABNORMAL HIGH (ref 0.61–1.24)
GFR calc Af Amer: 43 mL/min — ABNORMAL LOW (ref >60)
GFR calc Af Amer: 42 mL/min — ABNORMAL LOW (ref >60)
GFR calc non Af Amer: 37 mL/min — ABNORMAL LOW (ref >60)
GFR calc non Af Amer: 36 mL/min — ABNORMAL LOW (ref >60)
Glucose, Bld: 261 mg/dL — ABNORMAL HIGH (ref 70–99)
Glucose, Bld: 243 mg/dL — ABNORMAL HIGH (ref 70–99)
Potassium: 3.7 mmol/L (ref 3.5–5.1)
Potassium: 4.2 mmol/L (ref 3.5–5.1)
Sodium: 142 mmol/L (ref 135–145)
Sodium: 143 mmol/L (ref 135–145)

## 2019-02-21 LAB — GLUCOSE, CAPILLARY
Glucose-Capillary: 232 mg/dL — ABNORMAL HIGH (ref 70–99)
Glucose-Capillary: 237 mg/dL — ABNORMAL HIGH (ref 70–99)
Glucose-Capillary: 225 mg/dL — ABNORMAL HIGH (ref 70–99)
Glucose-Capillary: 216 mg/dL — ABNORMAL HIGH (ref 70–99)

## 2019-02-21 LAB — CBC
HCT: 26.8 % — ABNORMAL LOW (ref 39.0–52.0)
Hemoglobin: 9 g/dL — ABNORMAL LOW (ref 13.0–17.0)
MCH: 31.4 pg (ref 26.0–34.0)
MCHC: 33.6 g/dL (ref 30.0–36.0)
MCV: 93.4 fL (ref 80.0–100.0)
Platelets: 163 10*3/uL (ref 150–400)
RBC: 2.87 MIL/uL — ABNORMAL LOW (ref 4.22–5.81)
RDW: 12.9 % (ref 11.5–15.5)
WBC: 7.5 10*3/uL (ref 4.0–10.5)
nRBC: 0 % (ref 0.0–0.2)

## 2019-02-21 LAB — APTT
aPTT: 60 seconds — ABNORMAL HIGH (ref 24–36)
aPTT: 67 seconds — ABNORMAL HIGH (ref 24–36)
aPTT: 75 seconds — ABNORMAL HIGH (ref 24–36)

## 2019-02-21 LAB — HEPARIN LEVEL (UNFRACTIONATED): Heparin Unfractionated: 0.6 IU/mL (ref 0.30–0.70)

## 2019-02-21 NOTE — Progress Notes (Signed)
ANTICOAGULATION CONSULT NOTE: Follow-up  Pharmacy Consult for Eliquis --> heparin Indication: atrial fibrillation  No Known Allergies  Patient Measurements: Height: 5\' 8"  (172.7 cm) Weight: 163 lb 12.8 oz (74.3 kg) IBW/kg (Calculated) : 68.4 Heparin Dosing Weight: 78kg  Vital Signs: Temp: 99.2 F (37.3 C) (07/28 1907) Temp Source: Oral (07/28 1907) BP: 93/72 (07/28 1907) Pulse Rate: 117 (07/28 1907)  Labs: Recent Labs    02/19/19 7867 02/19/19 1913 02/20/19 0326 02/20/19 0947  02/21/19 0032 02/21/19 0809 02/21/19 1551 02/21/19 1848  HGB 9.5*  --  8.4*  --   --  9.0*  --   --   --   HCT 28.6*  --  25.1*  --   --  26.8*  --   --   --   PLT 163  --  145*  --   --  163  --   --   --   APTT 182* 145* 122*  --    < > 60* 67*  --  75*  HEPARINUNFRC 1.76* 1.38* 0.90*  --   --  0.60  --   --   --   CREATININE 1.92*  --   --  1.75*  --   --  1.63* 1.68*  --    < > = values in this interval not displayed.    Estimated Creatinine Clearance: 30 mL/min (A) (by C-G formula based on SCr of 1.68 mg/dL (H)).   Medications:  Eliquis 5 mg bid PTA. Last dose was on 7/23.  Assessment: Patient is an 85 y.o M with hx of dementia and Afib on Eliquis PTA, presented to the ED on 7/15 with AMS s/p fall. Eliquis was resumed on admission. Patient had suspected seizure activity on 7/24. Transitioned patient to heparin drip on 7/25 d/t AMS and inability to take oral meds.  APTT this AM was 67 (therapeutic) and rate was increased from 650 units/hr to 750 units/hr due to being on the low end of the goal range. Evening aPTT was therapeutic at 75 at this heparin rate. CBC stable, no bleeding noted.  Goal of Therapy:  Heparin level 0.3-0.7 units/ml aPTT 66-102 seconds Monitor platelets by anticoagulation protocol: Yes   Plan:  - Continue heparin drip at 750 units/hr  - OK to monitor by HL exclusively at this point with therapeutic aPTT - Monitor HL and CBC daily - Monitor for signs and  symptoms of bleeding  Agnes Lawrence, PharmD PGY1 Pharmacy Resident

## 2019-02-21 NOTE — Progress Notes (Signed)
Chart reviewed for LOS; B Javontay Vandam RN,MHA,BSN Advanced Care Supervisor [phone] 

## 2019-02-21 NOTE — Progress Notes (Signed)
[facility]  PROGRESS NOTE  Stephen Hester. YWV:[id number] DOB: 11/05/1928 DOA: 03/05/2019 [facility]CP: [facility]  Brief History    Stephen Reetz. is a 85 y.o. year old male with medical history significant for diabetes, dementia, hypertensionandA. fib on Eliquis who presented on 03/05/2019 with unwitnessed fall and altered mental status at his facility and was found to have acute metabolic encephalopathy related to hyponatremia/AKI in setting of dehydration and pneumonia.  Patient was transferred to [facility] on 7/25 due to concern for seizure-like activity and worsening mental status.  Patient was loaded with Keppra and is monitoring on continuous EEG.  Transferred out of neuro ICU on 7/25.  A & P     Acute metabolic encephalopathy in the setting of underlying dementia, unclear etiology, worsening.  Back to being very lethargic, not alert, not tracking on my exam, not responding to noxious stimuli, this is a noticeable change from yesterday and he was able to track and open his eyes to voice.  No sedating medications given, no tremor-like/seizure-like activity witnessed, most recent sodium within normal limits, infectious etiology thought less likely.  Will obtain stat CT head to sure no acute changes, additionally obtain ABG to ensure no retention of CO2 as well as BMP to ensure no overcorrection of sodium.  I suspect patient will continue to have poor mental status as his previous baseline is not much more alert and it is currently in with probably acutely worsened in setting of the combined metabolic impact of original presentation of sepsis/pneumonia complicated by seizure-like activity, hypernatremia in patient with known stroke history and dementia.  Discussed with daughter his very poor prognosis and concerned that this may be his new baseline but hopeful for potential reversible causes.   Possible seizure-like activity witnessed prior to transfer to [facility], no repeat  activity found on continuous EEG.  Neurology suspects this is delirium in setting of metabolic abnormalities above in patient with underlying dementia, continue Keppra to 500 mg twice daily   Left-sided pneumonia versus acute bronchitis, POA.  Patient completed a total 6-day course of cefepime.   Paroxysmal atrial fibrillation remains rate controlled.  PRN IV Toprol given diminished mental status, IV heparin given no p.o. intake.   Hypovolemic hypernatremia, in setting of dehydration/poor p.o. intake and patient with dementia and pneumonia, improving.  Sodium improved from 152 on admission to 142 on 7/25, briefly increased to 149 on 7/27 for now again improved 142 after D5 half-normal saline infusion over the last 24 hours.  Doing stat BMP now to ensure no overcorrection. Currently n.p.o. given inability to follow commands, speech following to determine ultimate nutritional source, suspect would likely need NG tube if family continues to pursue full scope of treatment   AKI with metabolic acidosis, suspect prerenal in the setting of diminished p.o. intake.  Creatinine 2.99/BUN 60 on admission, creatinine currently 1.63 unclear baseline,.  Ultrasound with no hydro-.  Holding home lisinopril and Aldactone.  Daily BMP, avoid nephrotoxins, monitor urine output, continue maintenance fluids   Type 2 diabetes, poorly controlled.  Presented in mild DKA.  A1c 9.6.  FBG at goal currently closely monitor CBGs, discontinue Levemir given currently n.p.o., sliding scale as needed   Elevated troponin, resolved.  Peak of 53 down trended to 45.  EKG nonischemic.  Presumed to be demand ischemia in setting of dehydration/AKI/pneumonia.   Normocytic anemia.  Remained stable in range of 9-10.  No reported blood loss.  Closely monitor.   Thrombocytopenia, resolved nadir  of 89, currently wnl, monitor   Hyperlipidemia. unable to tolerate p.o. given poor mental status continue home Crestor   Diffuse rash on  back, seems resolved on my exam  Started on Solu-Medrol and Pepcid for presumed contact dermatitis versus drug rash.  Will hold on continuing here and closely monitor     DVT prophylaxis: IV heparin Code Status: Full code Family Communication:Spoke with daughter, Stephen Hester at [phone], HPOA on 7/28, spoke about poor prognosis and concern the patient may continue to have fluctuating mental status given he had poor reserve related to his prior history of dementia/stroke. Disposition Plan: Continues to need inpatient admission given close monitoring of mental status in setting of diminished oral intake related to encephalopathic mental status, continue maintenance antiseizure medications, continue to closely monitor for resolution of metabolic abnormalities    [facility] Direct contact: see [email or web address] (further directions at bottom of note if needed) 7PM-7AM contact night coverage as at bottom of note 02/21/2019, 2:34 PM  LOS: 12 days   Consultants  . Neurology, ICU, palliative  Procedures  . Continuous EEG  Antibiotics  IV cefepime 7/18-7/24 Interval History/Subjective  No acute events overnight. Patient much less alert this morning for since yesterday (yesterday was able to open eyes to voice and track but remained nonverbal).  Today patient barely opens his eyes  Objective   Vitals:  Vitals:   02/21/19 1117 02/21/19 1210  BP: (!) 153/84 (!) 146/86  Pulse: 87   Resp: (!) 22   Temp: 99.9 F (37.7 C) 99.1 F (37.3 C)  SpO2: 100%     Exam:  nonverbal, not withdrawing to noxious stimuli in all extremities Will occasionally open his eyes to sternal rub Symmetrical Chest wall movement, Good air movement bilaterally, CTAB on anterior chest fail RRR,No Gallops,Rubs or new Murmurs, No Parasternal Heave +ve B.Sounds, Abd Soft, No rebound - guarding or rigidity. No Cyanosis, Clubbing or edema, No new Rash or bruise     I have personally reviewed the following:   Data  Reviewed: Basic Metabolic Panel: Recent Labs  Lab 02/15/19 0607  02/17/19 0600 02/18/19 0627 02/19/19 0812 02/20/19 0947 02/20/19 1437 02/21/19 0809  NA 140   < > 141 142 147* 149*  --  142  K 3.9   < > 3.8 3.9 3.7 3.3*  --  3.7  CL 114*   < > 114* 110 112* 115*  --  109  CO2 19*   < > 18* 23 23 25   --  25  GLUCOSE 114*   < > 132* 331* 154* 104*  --  261*  BUN 28*   < > 26* 33* 34* 29*  --  22  CREATININE 1.39*   < > 1.45* 1.73* 1.92* 1.75*  --  1.63*  CALCIUM 8.8*   < > 8.9 8.7* 8.8* 8.5*  --  8.4*  MG 1.9  --   --  2.0  --   --  1.9  --   PHOS 2.7  --   --   --   --   --   --   --    < > = values in this interval not displayed.   Liver Function Tests: Recent Labs  Lab 02/15/19 0607  AST 28  ALT 14  ALKPHOS 51  BILITOT 0.7  PROT 6.1*  ALBUMIN 3.3*   No results for input(s): LIPASE, AMYLASE in the last 168 hours. Recent Labs  Lab 02/16/19 1331 02/17/19 0600  AMMONIA 36* 18  CBC: Recent Labs  Lab 02/15/19 0607 02/16/19 0652  02/17/19 0600 02/18/19 0627 02/19/19 0812 02/20/19 0326 02/21/19 0032  WBC 5.7 5.3  --  5.4 6.3 11.4* 6.6 7.5  NEUTROABS 3.5 3.5  --  3.9 5.5 8.7*  --   --   HGB 10.1* 9.9*  --  10.4* 9.6* 9.5* 8.4* 9.0*  HCT 31.2* 30.4*   < > 32.3* 29.6* 28.6* 25.1* 26.8*  MCV 96.3 95.6  --  94.7 94.0 91.4 92.6 93.4  PLT 115* 129*  --  143* 161 163 145* 163   < > = values in this interval not displayed.   Cardiac Enzymes: No results for input(s): CKTOTAL, CKMB, CKMBINDEX, TROPONINI in the last 168 hours. BNP (last 3 results) No results for input(s): BNP in the last 8760 hours.  ProBNP (last 3 results) No results for input(s): PROBNP in the last 8760 hours.  CBG: Recent Labs  Lab 02/20/19 1055 02/20/19 1605 02/20/19 2103 02/21/19 0621 02/21/19 1116  GLUCAP 112* 149* 136* 232* 237*    Recent Results (from the past 240 hour(s))  Novel Coronavirus, NAA (hospital order; send-out to ref lab)     Status: None   Collection Time: 02/15/19   5:26 PM   Specimen: Nasopharyngeal Swab; Respiratory  Result Value Ref Range Status   SARS-CoV-2, NAA NOT DETECTED NOT DETECTED Final    Comment: (NOTE) This test was developed and its performance characteristics determined by [facility]. This test has not been FDA cleared or approved. This test has been authorized by FDA under an Emergency Use Authorization (EUA). This test is only authorized for the duration of time the declaration that circumstances exist justifying the authorization of the emergency use of in vitro diagnostic tests for detection of SARS-CoV-2 virus and/or diagnosis of COVID-19 infection under section 564(b)(1) of the Act, 21 U.S.C. 161WRU-0(A)(5360bbb-3(b)(1), unless the authorization is terminated or revoked sooner. When diagnostic testing is negative, the possibility of a false negative result should be considered in the context of a patient's recent exposures and the presence of clinical signs and symptoms consistent with COVID-19. An individual without symptoms of COVID-19 and who is not shedding SARS-CoV-2 virus would expect to have a negative (not detected) result in this assay. Performed  At: [facility] [address] [place], [place] [id number] Jolene SchimkeNagendra Sanjai MD NW:[id number]:[phone]    Coronavirus Source NASOPHARYNGEAL  Final    Comment: Performed at [facility], [address] [place], [place] [address]  Culture, Urine     Status: None   Collection Time: 02/18/19  7:50 AM   Specimen: Urine, Clean Catch  Result Value Ref Range Status   Specimen Description   Final    URINE, CLEAN CATCH Performed at [facility], [address] [place], [place] [address]    Special Requests   Final    NONE Performed at [facility], [address] [place], [place] [address]    Culture   Final    NO GROWTH Performed at [facility], [address] [place], [place] [address]    Report Status  02/19/2019 FINAL  Final     Studies: No results found.  Scheduled Meds: . bisacodyl  10 mg Rectal Daily  . guaiFENesin  1,200 mg Oral BID  . insulin aspart  0-5 Units Subcutaneous QHS  . insulin aspart  0-9 Units Subcutaneous TID WC  . magnesium oxide  400 mg Oral QODAY  . sodium chloride flush  10-40 mL Intracatheter Q12H   Continuous Infusions: . heparin 750 Units/hr (02/21/19 0952)  . 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

## 2019-02-21 NOTE — Progress Notes (Signed)
Initial Nutrition Assessment  DOCUMENTATION CODES:   Not applicable  INTERVENTION:   Recommend placement of Cortrak with initiation of enteral nutrition as pt is day 5 without nutrition. Service offered Friday 8am-4pm.   Once tube placed:  -Osmolite @ 55 ml/hr -30 ml Prostat BID  Provides: 2180 kcals, 113 grams protein, 1006 ml free water.   NUTRITION DIAGNOSIS:   Inadequate oral intake related to lethargy/confusion as evidenced by NPO status.  GOAL:   Patient will meet greater than or equal to 90% of their needs  MONITOR:   PO intake, Supplement acceptance, TF tolerance, Weight trends, Labs, I & O's  REASON FOR ASSESSMENT:   Malnutrition Screening Tool    ASSESSMENT:   Patient with PMH significant for DM, dementia, and HTN. Presents this admission with hyponatremia/AKI in setting of dehydration and PNA.   7/25- transferred to [facility] due to seizure like activity  Unable to obtain history from pt. Spoke with RN who reports pt has been unable to pass swallow evaluation since 7/23 due to ongoing AMS. PMT spoke with family regarding [place]. They wish full scope treatment. Recommend placement of Cortrak with initiation of enteral nutrition.   Weight history is limited in records.  Admission weight: 78.2 kg Current weight 74.3 kg  Medications: dulcolax, SS novolog, mag ox Labs: CBG 243-261  Diet Order:   Diet Order            Diet NPO time specified  Diet effective now              EDUCATION NEEDS:   Not appropriate for education at this time  Skin:  Skin Assessment: Reviewed RN Assessment  Last BM:  7/27  Height:   Ht Readings from Last 1 Encounters:  02/09/19 5\' 8"  (1.727 m)    Weight:   Wt Readings from Last 1 Encounters:  02/18/19 74.3 kg    Ideal Body Weight:  70 kg  BMI:  Body mass index is 24.91 kg/m.  Estimated Nutritional Needs:   Kcal:  2000-2200 kcal  Protein:  100-120 grams  Fluid:  >/= 2 L/day   Mariana Single RD,  LDN Clinical Nutrition Pager # - [phone]

## 2019-02-21 NOTE — Progress Notes (Signed)
  Speech Language Pathology Treatment: Dysphagia  Patient Details Name: Stephen Hester. MRN: [id number] DOB: 08-Feb-1929 Today's Date: 02/21/2019 Time: 1950-9326 SLP Time Calculation (min) (ACUTE ONLY): 10 min  Assessment / Plan / Recommendation Clinical Impression  Pt is less alert and interactive than during evaluation on previous date, opening his eyes only once and showing no indication of oral awareness throughout oral care and attempts to offer boluses. Given lethargy and Total A needed for bolus acceptance, no boluses were actually administered for his safety. Would maintain NPO status pending improvements in mentation.    HPI HPI: Stephen Hester. is a 85 y.o. male with history of diabetes, dementia, hypertension and A. fib on Eliquis presenting with unwitnessed fall and altered mental status. BSE was completed earlier this admission on 7/15 with recommendation for Dys 2 diet and thin liquids given minimal dentition and cognitive impairment. Pt had a further decline in mentation with concern for seizure, prompting transfer to [facility] and repeat swallow evaluation.      SLP Plan  Continue with current plan of care       Recommendations  Diet recommendations: NPO Medication Administration: Via alternative means                Oral Care Recommendations: Oral care QID Follow up Recommendations: 24 hour supervision/assistance SLP Visit Diagnosis: Dysphagia, oral phase (R13.11) Plan: Continue with current plan of care       GO                Venita Sheffield Pradyun Ishman 02/21/2019, 10:48 AM  Pollyann Glen, M.A. [place] Acute Environmental education officer [phone] Office [phone]

## 2019-02-21 NOTE — Progress Notes (Signed)
[place] for Eliquis --> heparin Indication: atrial fibrillation  No Known Allergies  Patient Measurements: Height: 5\' 8"  (172.7 cm) Weight: 163 lb 12.8 oz (74.3 kg) IBW/kg (Calculated) : 68.4 Heparin Dosing Weight: 78kg  Vital Signs: Temp: 98 F (36.7 C) (07/27 2355) Temp Source: Oral (07/27 2355) BP: 151/91 (07/27 2355) Pulse Rate: 85 (07/27 2355)  Labs: Recent Labs    02/18/19 0086  02/19/19 7619 02/19/19 1913 02/20/19 0326 02/20/19 0947 02/20/19 1437 02/21/19 0032  HGB 9.6*  --  9.5*  --  8.4*  --   --  9.0*  HCT 29.6*  --  28.6*  --  25.1*  --   --  26.8*  PLT 161  --  163  --  145*  --   --  163  APTT  --    < > 182* 145* 122*  --  70* 60*  HEPARINUNFRC  --    < > 1.76* 1.38* 0.90*  --   --  0.60  CREATININE 1.73*  --  1.92*  --   --  1.75*  --   --    < > = values in this interval not displayed.    Estimated Creatinine Clearance: 28.8 mL/min (A) (by C-G formula based on SCr of 1.75 mg/dL (H)).   Medications:  Eliquis 5 mg bid PTA. Last dose was on 7/23.  Assessment: Patient is an 85 y.o M with hx of dementia and Afib on Eliquis PTA, presented to the ED on 7/15 with AMS s/p fall. Eliquis was resumed on admission. Patient had suspected seizure activity on 7/24. Transitioned patient to heparin drip on 7/25 d/t AMS and inability to take oral meds.  APTT this evening 60 sec, heparin level still falsely elevated at 0.6 units/ml.  No bleeding reported  Goal of Therapy:  Heparin level 0.3-0.7 units/ml aPTT 66-102 seconds Monitor platelets by anticoagulation protocol: Yes   Plan:  - Increase heparin drip to 650 units/hr - aPTT in 8 hours  - Monitor daily aPTT and heparin level, daily CBC - Monitor for signs and symptoms of bleeding   Thanks for allowing pharmacy to be a part of this patient's care.  Excell Seltzer, PharmD Clinical Pharmacist

## 2019-02-21 NOTE — Progress Notes (Signed)
Transferred to 1U83. All belonging with the patient. Daughter, Pamala Hurry, called and notified.

## 2019-02-21 NOTE — Progress Notes (Signed)
[place] for Eliquis --> heparin Indication: atrial fibrillation  No Known Allergies  Patient Measurements: Height: 5\' 8"  (172.7 cm) Weight: 163 lb 12.8 oz (74.3 kg) IBW/kg (Calculated) : 68.4 Heparin Dosing Weight: 78kg  Vital Signs: Temp: 99.3 F (37.4 C) (07/28 0700) Temp Source: Axillary (07/28 0700) BP: 156/91 (07/28 0700) Pulse Rate: 83 (07/28 0700)  Labs: Recent Labs    02/19/19 0812 02/19/19 1913 02/20/19 0326 02/20/19 0947 02/20/19 1437 02/21/19 0032 02/21/19 0809  HGB 9.5*  --  8.4*  --   --  9.0*  --   HCT 28.6*  --  25.1*  --   --  26.8*  --   PLT 163  --  145*  --   --  163  --   APTT 182* 145* 122*  --  70* 60* 67*  HEPARINUNFRC 1.76* 1.38* 0.90*  --   --  0.60  --   CREATININE 1.92*  --   --  1.75*  --   --  1.63*    Estimated Creatinine Clearance: 30.9 mL/min (A) (by C-G formula based on SCr of 1.63 mg/dL (H)).   Medications:  Eliquis 5 mg bid PTA. Last dose was on 7/23.  Assessment: Patient is an 85 y.o M with hx of dementia and Afib on Eliquis PTA, presented to the ED on 7/15 with AMS s/p fall. Eliquis was resumed on admission. Patient had suspected seizure activity on 7/24. Transitioned patient to heparin drip on 7/25 d/t AMS and inability to take oral meds.  APTT this morning is 67 sec after increasing to 650 units/hr overnight. This is on the low end of therapeutic. Heparin levels are still not correlating so will continue to rely on aPTT until heparin level is not falsely elevated.   CBC stable. No bleeding reported  Goal of Therapy:  Heparin level 0.3-0.7 units/ml aPTT 66-102 seconds Monitor platelets by anticoagulation protocol: Yes   Plan:  - Increase heparin drip to 750 units/hr since the patient is on the low end of the goal range - aPTT in 8 hours  - Monitor daily aPTT and heparin level, daily CBC - Monitor for signs and symptoms of bleeding  Kennon Holter, PharmD PGY1 Ambulatory  Care Pharmacy Resident [employer] Phone: [phone]

## 2019-02-21 NOTE — Progress Notes (Signed)
Physical Therapy Treatment Patient Details Name: Stephen Muslimlex Leak Jr. MRN: [id number] DOB: 09-19-1928 Today's Date: 02/21/2019    History of Present Illness Pt admitted with AMS 2* metabolic encephalopathy.  Pt wtih hx of dementia and a-fib    PT Comments    Emphasis on getting pt up to EOB to see if pt would become more responsive and follow commands. Pt unable to sit up EOB without assist, reacted to finger coming toward his eyes, but not startle responses.   Follow Up Recommendations  SNF     Equipment Recommendations  None recommended by PT    Recommendations for Other Services       Precautions / Restrictions Precautions Precautions: Fall Precaution Comments: low responsiveness    Mobility  Bed Mobility Overal bed mobility: Needs Assistance Bed Mobility: Sit to Supine;Supine to Sit     Supine to sit: Mod assist Sit to supine: Mod assist;+2 for physical assistance   General bed mobility comments: Assist for direction and trunk to come up and forward to EOB  Transfers                 General transfer comment: unable today  Ambulation/Gait             General Gait Details: unable to follow simple direction   Stairs             Wheelchair Mobility    Modified Rankin (Stroke Patients Only)       Balance Overall balance assessment: Needs assistance Sitting-balance support: Feet supported;No upper extremity supported Sitting balance-Leahy Scale: Poor     Standing balance support: Single extremity supported;Bilateral upper extremity supported Standing balance-Leahy Scale: Poor Standing balance comment: sat EOB for 15 min working to gain startle responses, to pull  in resistance forward or back                            Cognition Arousal/Alertness: Lethargic Behavior During Therapy: Flat affect Overall Cognitive Status: History of cognitive impairments - at baseline                                 General  Comments: h/o dementia; did not follow commands today, hand over hand assist given      Exercises Other Exercises Other Exercises: AAROM/PROM to v    General Comments        Pertinent Vitals/Pain Faces Pain Scale: No hurt    Home Living                      Prior Function            PT Goals (current goals can now be found in the care plan section) Acute Rehab PT Goals Patient Stated Goal: No goals stated PT Goal Formulation: Patient unable to participate in goal setting Time For Goal Achievement: 01-06-2019 Potential to Achieve Goals: Fair Progress towards PT goals: Not progressing toward goals - comment(not alert enough to participate)    Frequency    Min 2X/week      PT Plan Current plan remains appropriate    Co-evaluation              AM-PAC PT "6 Clicks" Mobility   Outcome Measure  Help needed turning from your back to your side while in a flat bed without using bedrails?: A Little Help needed moving from lying  on your back to sitting on the side of a flat bed without using bedrails?: A Lot Help needed moving to and from a bed to a chair (including a wheelchair)?: A Lot Help needed standing up from a chair using your arms (e.g., wheelchair or bedside chair)?: A Lot Help needed to walk in hospital room?: A Lot Help needed climbing 3-5 steps with a railing? : Total 6 Click Score: 12    End of Session     Patient left: in bed;with call bell/phone within reach;with bed alarm set Nurse Communication: Mobility status PT Visit Diagnosis: Muscle weakness (generalized) (M62.81);Other symptoms and signs involving the nervous system (R29.898)     Time: 1410-1430 PT Time Calculation (min) (ACUTE ONLY): 20 min  Charges:  $Therapeutic Activity: 8-22 mins                     02/21/2019  Donnella Sham, PT Acute Rehabilitation Services [phone]  (pager) [phone]  (office)   Tessie Fass Kanai Berrios 02/21/2019, 5:28 PM

## 2019-02-22 ENCOUNTER — Inpatient Hospital Stay (HOSPITAL_COMMUNITY): Payer: Medicare PPO

## 2019-02-22 DIAGNOSIS — R401 Stupor: Secondary | ICD-10-CM

## 2019-02-22 DIAGNOSIS — I959 Hypotension, unspecified: Secondary | ICD-10-CM

## 2019-02-22 DIAGNOSIS — J9601 Acute respiratory failure with hypoxia: Secondary | ICD-10-CM

## 2019-02-22 DIAGNOSIS — R509 Fever, unspecified: Secondary | ICD-10-CM

## 2019-02-22 DIAGNOSIS — R5081 Fever presenting with conditions classified elsewhere: Secondary | ICD-10-CM

## 2019-02-22 LAB — BASIC METABOLIC PANEL
Anion gap: 15 (ref 5–15)
BUN: 32 mg/dL — ABNORMAL HIGH (ref 8–23)
CO2: 16 mmol/L — ABNORMAL LOW (ref 22–32)
Calcium: 8.4 mg/dL — ABNORMAL LOW (ref 8.9–10.3)
Chloride: 113 mmol/L — ABNORMAL HIGH (ref 98–111)
Creatinine, Ser: 2.96 mg/dL — ABNORMAL HIGH (ref 0.61–1.24)
GFR calc Af Amer: 21 mL/min — ABNORMAL LOW (ref >60)
GFR calc non Af Amer: 18 mL/min — ABNORMAL LOW (ref >60)
Glucose, Bld: 317 mg/dL — ABNORMAL HIGH (ref 70–99)
Potassium: 4.6 mmol/L (ref 3.5–5.1)
Sodium: 144 mmol/L (ref 135–145)

## 2019-02-22 LAB — BLOOD GAS, ARTERIAL
Acid-base deficit: 4.9 mmol/L — ABNORMAL HIGH (ref 0.0–2.0)
Bicarbonate: 18.1 mmol/L — ABNORMAL LOW (ref 20.0–28.0)
Drawn by: 511911
FIO2: 21
O2 Saturation: 96.5 %
Patient temperature: 99.4
pCO2 arterial: 25.4 mmHg — ABNORMAL LOW (ref 32.0–48.0)
pH, Arterial: 7.468 — ABNORMAL HIGH (ref 7.350–7.450)
pO2, Arterial: 81.2 mmHg — ABNORMAL LOW (ref 83.0–108.0)

## 2019-02-22 LAB — GLUCOSE, CAPILLARY: Glucose-Capillary: 277 mg/dL — ABNORMAL HIGH (ref 70–99)

## 2019-02-22 LAB — LACTIC ACID, PLASMA
Lactic Acid, Venous: 7.7 mmol/L (ref 0.5–1.9)
Lactic Acid, Venous: 9.5 mmol/L (ref 0.5–1.9)

## 2019-02-22 LAB — PREPARE RBC (CROSSMATCH)

## 2019-02-22 LAB — CBC
HCT: 22.5 % — ABNORMAL LOW (ref 39.0–52.0)
Hemoglobin: 6.9 g/dL — CL (ref 13.0–17.0)
MCH: 31.4 pg (ref 26.0–34.0)
MCHC: 30.7 g/dL (ref 30.0–36.0)
MCV: 102.3 fL — ABNORMAL HIGH (ref 80.0–100.0)
Platelets: 185 10*3/uL (ref 150–400)
RBC: 2.2 MIL/uL — ABNORMAL LOW (ref 4.22–5.81)
RDW: 13.2 % (ref 11.5–15.5)
WBC: 11.5 10*3/uL — ABNORMAL HIGH (ref 4.0–10.5)
nRBC: 0.2 % (ref 0.0–0.2)

## 2019-02-22 LAB — ABO/RH: ABO/RH(D): B POS

## 2019-02-22 LAB — HEPARIN LEVEL (UNFRACTIONATED): Heparin Unfractionated: 0.8 IU/mL — ABNORMAL HIGH (ref 0.30–0.70)

## 2019-02-22 MED ORDER — LACTATED RINGERS IV SOLN
INTRAVENOUS | Status: DC
  Administered 2019-02-22: 07:00:00 via INTRAVENOUS

## 2019-02-22 MED ORDER — MORPHINE 100MG IN NS 100ML (1MG/ML) PREMIX INFUSION
1.0000 mg/h | INTRAVENOUS | Status: DC
  Filled 2019-02-22: qty 100

## 2019-02-22 MED ORDER — GLYCOPYRROLATE 0.2 MG/ML IJ SOLN
0.2000 mg | Freq: Four times a day (QID) | INTRAMUSCULAR | Status: DC
  Administered 2019-02-22 (×2): 0.2 mg via INTRAVENOUS
  Filled 2019-02-22 (×2): qty 1

## 2019-02-22 MED ORDER — CHLORHEXIDINE GLUCONATE 0.12 % MT SOLN
15.0000 mL | Freq: Two times a day (BID) | OROMUCOSAL | Status: DC
  Administered 2019-02-22 (×2): 15 mL via OROMUCOSAL
  Filled 2019-02-22: qty 15

## 2019-02-22 MED ORDER — SODIUM CHLORIDE 0.9 % IV BOLUS
500.0000 mL | Freq: Once | INTRAVENOUS | Status: AC
  Administered 2019-02-22: 500 mL via INTRAVENOUS

## 2019-02-22 MED ORDER — LORAZEPAM BOLUS VIA INFUSION
1.0000 mg | INTRAVENOUS | Status: DC | PRN
  Filled 2019-02-22: qty 1

## 2019-02-22 MED ORDER — HALOPERIDOL LACTATE 5 MG/ML IJ SOLN: 1.0000 mg | Freq: Four times a day (QID) | INTRAMUSCULAR | Status: DC | PRN

## 2019-02-22 MED ORDER — LORAZEPAM 2 MG/ML IJ SOLN
2.0000 mg | INTRAMUSCULAR | Status: DC | PRN
  Administered 2019-02-22 (×2): 2 mg via INTRAVENOUS
  Filled 2019-02-22 (×2): qty 1

## 2019-02-22 MED ORDER — ORAL CARE MOUTH RINSE: 15.0000 mL | Freq: Two times a day (BID) | OROMUCOSAL | Status: DC

## 2019-02-22 MED ORDER — HYDROMORPHONE BOLUS VIA INFUSION
0.5000 mg | INTRAVENOUS | Status: DC | PRN
  Filled 2019-02-22: qty 1

## 2019-02-22 MED ORDER — LACTATED RINGERS IV BOLUS
500.0000 mL | Freq: Once | INTRAVENOUS | Status: AC
  Administered 2019-02-22: 04:00:00 via INTRAVENOUS

## 2019-02-22 MED ORDER — SODIUM CHLORIDE 0.9% IV SOLUTION
Freq: Once | INTRAVENOUS | Status: AC
  Administered 2019-02-22: 07:00:00 via INTRAVENOUS

## 2019-02-22 MED ORDER — HYDROMORPHONE HCL 1 MG/ML IJ SOLN
0.5000 mg | INTRAMUSCULAR | Status: DC | PRN
  Administered 2019-02-22 (×2): 0.5 mg via INTRAVENOUS
  Filled 2019-02-22 (×2): qty 0.5

## 2019-02-22 MED ORDER — LORAZEPAM 2 MG/ML IJ SOLN: 2.0000 mg | INTRAMUSCULAR | Status: DC | PRN

## 2019-02-22 MED ORDER — LORAZEPAM 2 MG/ML IJ SOLN
1.0000 mg/h | INTRAVENOUS | Status: DC
  Administered 2019-02-22: 1 mg/h via INTRAVENOUS
  Filled 2019-02-22: qty 25

## 2019-02-22 MED ORDER — SODIUM CHLORIDE 0.9 % IV SOLN
0.5000 mg/h | INTRAVENOUS | Status: DC
  Filled 2019-02-22: qty 5

## 2019-02-23 LAB — TYPE AND SCREEN
ABO/RH(D): B POS
Antibody Screen: NEGATIVE
Unit division: 0

## 2019-02-23 LAB — BPAM RBC
Blood Product Expiration Date: 202008202359
Unit Type and Rh: 7300

## 2019-02-23 NOTE — Progress Notes (Signed)
Physician notified,  Stephen Fantasia NP  came and filled out the death certificate, Pt family also notified, spoke with daughter Abbey Chatters on the phone stated family was expecting the call and were very thankful for the care,   Emotional support given to family by RN. [place] donor notified.&Pt not a candidate.   , Body prepared and sent to the [place] home to pick up the body .

## 2019-02-23 NOTE — Progress Notes (Signed)
41 ml of ativan 1 mg/ml concentration  wasted in the steri cycle after pt 2205 and witnessed by a second RN Phil CN.  Marland Kitchen

## 2019-02-25 NOTE — Progress Notes (Addendum)
Palliative Medicine RN Note: Re-consult order noted upon chart review. After significant decline overnight, patient is now comfort care. Family is at bedside. Attending had ordered morphine infusion and lorazepam for seizures already. Per RN, daughters do not want morphine infusion started until they leave. Two daughters will engage with me, but one daughter, who is holding and stroking the patient's hand, listens without engaging.  Did not respond to touch or voice; did not check pain response. Patient is groaning with facial grimacing and labored breathing. Some audible secretions. I discussed my fear that Mr Wendie Simmer is suffering AEB those 3 signs; daughters agree. While they are not ready to start a continuous infusion of medication, they did agree that a small dose of medication for his comfort would be good. We also discussed the use of Robinul for secretions; they agree to that as well.   I confirmed that the goal is to provide comfort and dignity. Daughters asked about what will happen when the drip is started and if his death will happen quickly after that. We discussed the idea of "low and slow" with use of medications and that his dose will be small enough that it shouldn't affect respiratory drive. However, all patients have different paths as they progress towards death, and they understand that it can be unpredictable. Some patients relax and sleep and hang on, and some patients relax and are able to let go. They verbalized understanding and appeared more comfortable with the idea of a dose being given slowly. We discussed changes the body goes through as it dies, and they verbalize understanding.  I reviewed his orders and notes with Dr Hilma Favors, who stopped the morphine due to elevated SCr and possibility of myoclonus. She also adjusted lorazepam in case of seizure and added Robinul. Other orders adjusted to reflect goal of comfort.  Discussed order changes with family, who verbalized  understanding. PMT will stop APAP orders at their request, as they do not feel that getting a suppository is comfort-focused. Patient is not hot or febrile now, and is, in fact, cool to the touch in BUE.   Plan for PMT follow up later today.   Ring was removed from 4th digit of left hand (gold-colored with clear stones) and given to daughter Abbey Chatters.  Marjie Skiff Annarose Ouellet, RN, BSN, [facility] Palliative Medicine Team 02/21/2019 11:53 AM Office [phone]

## 2019-02-25 NOTE — Progress Notes (Signed)
Physician made aware of critical lactic acid of 9.5

## 2019-02-25 NOTE — Progress Notes (Signed)
Physician made aware of patient current condition.

## 2019-02-25 NOTE — Progress Notes (Signed)
RN came to discuss  the plan of care with  family members at [facility] and give some emotional support. Then during  Pt. assessement RN noted pt blinked his eyes and started having a seizure-  like activity evidence by  bilateral twitching of upper extremities with eyes open androlling to letf tside. with  groaning and moaning sounds.  This lasted for about 45 seconds Vs    Post seizure are as follows. T 98.0 Pulse 134-140 bpm  Resp. 34 bpm BP 125/105 O2SAT 98-100 % with non re breather mask .An  ativan drip infusing a@ 1 ml/hr. Family and pt reassured. Pt repositioned and made comfortable. RN will continue to monitor

## 2019-02-25 NOTE — Progress Notes (Signed)
Noted pt has a seizurel ike activity with jerking movement of RUE and RLE with eye deviation to the left for 45 seconds , ativan given MD notified New orders given and  Rapid response RN also notified for continues decrease in mentation.  Marland Kitchen

## 2019-02-25 NOTE — Significant Event (Signed)
Pt expired at 2205 hours. Pronounced by 2 RNs. Pt was a DNR, comfort care and death was expected. Death certificate completed and given to [place], Therapist, sports.  KJKG, NP [facility]

## 2019-02-25 NOTE — Progress Notes (Signed)
Palliative Medicine RN Note: Afternoon symptom check. Pt appears comfortable but had another episode of seizure-like activity. Obtained rx from Dr Hilma Favors for Ativan infusion for suppression. I discussed clearly with the nurse in layman's terms the indications for the Ativan infusion and the hydromorphone benefits, as a daughter is still at the bedside. Previously, this daughter did not engage with me but was listening during discussions with other family members.   The daughter who was present asked about feeding him; I explained that at this time there is no benefit and that the GI system shuts down as part of the dying process. She did not verbalize understanding but said "OK." RN reports that she asked if we are "giving up" on Mr Leak. Family will need continued support.   Requested support from [place]. I will f/u in the am if he survives the night.  Marjie Skiff Malayah Demuro, RN, BSN, [facility] Palliative Medicine Team 02/09/2019 2:26 PM Office [phone]

## 2019-02-25 NOTE — Progress Notes (Signed)
Patient ID: Stephen Krebs., male   DOB: 05/25/1929, 85 y.o.   MRN: [id number]  PROGRESS NOTE    Stephen Krebs.  ENI:[id number] DOB: Nov 22, 1928 DOA: 02/24/2019 PCP: [facility]   Brief Narrative:  85 year old male with history of diabetes, dementia, hypertension, A. fib on Eliquis presented on 02/08/2027 unwitnessed fall and altered mental status at this facility and was found to have acute metabolic encephalopathy related to hypernatremia/AKI in setting of dehydration and pneumonia.  Patient was transferred to [facility] on 725 due to concern for seizure-like activity and worsening mental status.  He was started on Keppra.  Neurology was consulted.  He was transferred out of neuro ICU on 02/18/2019.  Patient has remained lethargic and unresponsive.  Early in the morning of March 07, 2019, he was found to be even less responsive with hypotension and hypoxia with seizure-like activity.  Assessment & Plan:   Acute hypoxic respiratory failure Hypotension Seizures Acute metabolic encephalopathy in a patient with underlying dementia -Patient has been hypotensive overnight and has required increasing amount of oxygen.  Currently on nonrebreather and still hypoxic and hypotensive.  Lactic acid is also elevated.  Overall prognosis is very poor. -Repeat CT of the head done earlier this morning showed no acute findings.  Repeat chest x-ray also shows no acute abnormality. -Patient has received IV fluid boluses overnight and will give normal saline 500 cc bolus again. -Spoke to the patient's daughter/Stephen Hester on phone and recommended comfort measures.  Family is on the way to see the patient.  Addendum: Family has seen the patient and is requesting for comfort measures.  Will discontinue further labs/imaging and other medications and start morphine drip and continue oxygen supplementation.  Overall prognosis is very poor.  Will use IV Ativan for seizure-like activity.  Continue IV Keppra for now    Left-sided pneumonia, present on admission -Completed 6 days of cefepime  Paroxysmal A. Fib -Discontinue IV heparin as patient with a comfort measures  Hypovolemic hypernatremia: Improving AKI with metabolic acidosis: We will not repeat any labs Diabetes mellitus type II: Comfort measures Thrombocytopenia Hyperlipidemia Normocytic anemia Elevated troponin, resolved  DVT prophylaxis: DC heparin for comfort measures Code Status: DNR Family Communication: Spoke with daughter/Stephen Hester on phone Disposition Plan: Expect in-hospital death  Consultants: Nephrology/PCCM/palliative care  Procedures: Continuous EEG  Antimicrobials: Cefepime from 7/18-7/24/2020   Subjective: Patient seen and examined at bedside.  He is totally unresponsive in frontal deep painful stimuli.  Currently on nonrebreather.  Apparently had a seizure episode this morning as per nursing staff.  Objective: Vitals:   Mar 07, 2019 0808 03/07/19 0816 03-07-19 0831 2019/03/07 0846  BP: (!) 70/33 (!) 69/44 (!) 72/50 (!) 70/59  Pulse: (!) 109 79 (!) 105 (!) 102  Resp:      Temp:      TempSrc:      SpO2:  100% 100% 100%  Weight:      Height:        Intake/Output Summary (Last 24 hours) at Mar 07, 2019 0951 Last data filed at 03-07-2019 0800 Gross per 24 hour  Intake 1075 ml  Output 1400 ml  Net -325 ml   Filed Weights   02/09/19 1000 02/18/19 2316 03/07/19 0335  Weight: 78.2 kg 74.3 kg 75.9 kg    Examination:  General exam: Appears chronically ill.  Unresponsive. Respiratory system: Bilateral decreased breath sounds at bases with scattered crackles Cardiovascular system: S1 & S2 heard, intermittently tachycardic Gastrointestinal system: Abdomen is nondistended, soft and nontender. Normal bowel sounds heard.  Extremities: No cyanosis, clubbing; trace edema Central nervous system: Unresponsive; does not respond even to deep painful stimuli.  Skin: No petechiae or purpura  psychiatry: Could not be assessed  because of mental status    Data Reviewed: I have personally reviewed following labs and imaging studies  CBC: Recent Labs  Lab 02/16/19 0652  02/17/19 0600 02/18/19 0627 02/19/19 0812 02/20/19 0326 02/21/19 0032 02/02/2019 0342  WBC 5.3  --  5.4 6.3 11.4* 6.6 7.5 11.5*  NEUTROABS 3.5  --  3.9 5.5 8.7*  --   --   --   HGB 9.9*  --  10.4* 9.6* 9.5* 8.4* 9.0* 6.9*  HCT 30.4*   < > 32.3* 29.6* 28.6* 25.1* 26.8* 22.5*  MCV 95.6  --  94.7 94.0 91.4 92.6 93.4 102.3*  PLT 129*  --  143* 161 163 145* 163 185   < > = values in this interval not displayed.   Basic Metabolic Panel: Recent Labs  Lab 02/18/19 0627 02/19/19 0812 02/20/19 0947 02/20/19 1437 02/21/19 0809 02/21/19 1551 02/06/2019 0342  NA 142 147* 149*  --  142 143 144  K 3.9 3.7 3.3*  --  3.7 4.2 4.6  CL 110 112* 115*  --  109 109 113*  CO2 23 23 25   --  25 25 16*  GLUCOSE 331* 154* 104*  --  261* 243* 317*  BUN 33* 34* 29*  --  22 22 32*  CREATININE 1.73* 1.92* 1.75*  --  1.63* 1.68* 2.96*  CALCIUM 8.7* 8.8* 8.5*  --  8.4* 8.5* 8.4*  MG 2.0  --   --  1.9  --   --   --    GFR: Estimated Creatinine Clearance: 17 mL/min (A) (by C-G formula based on SCr of 2.96 mg/dL (H)). Liver Function Tests: No results for input(s): AST, ALT, ALKPHOS, BILITOT, PROT, ALBUMIN in the last 168 hours. No results for input(s): LIPASE, AMYLASE in the last 168 hours. Recent Labs  Lab 02/16/19 1331 02/17/19 0600  AMMONIA 36* 18   Coagulation Profile: No results for input(s): INR, PROTIME in the last 168 hours. Cardiac Enzymes: No results for input(s): CKTOTAL, CKMB, CKMBINDEX, TROPONINI in the last 168 hours. BNP (last 3 results) No results for input(s): PROBNP in the last 8760 hours. HbA1C: No results for input(s): HGBA1C in the last 72 hours. CBG: Recent Labs  Lab 02/21/19 0621 02/21/19 1116 02/21/19 1702 02/21/19 2258 01/31/2019 0656  GLUCAP 232* 237* 225* 216* 277*   Lipid Profile: No results for input(s): CHOL, HDL,  LDLCALC, TRIG, CHOLHDL, LDLDIRECT in the last 72 hours. Thyroid Function Tests: No results for input(s): TSH, T4TOTAL, FREET4, T3FREE, THYROIDAB in the last 72 hours. Anemia Panel: No results for input(s): VITAMINB12, FOLATE, FERRITIN, TIBC, IRON, RETICCTPCT in the last 72 hours. Sepsis Labs: Recent Labs  Lab 02/18/19 1049 01/30/2019 0535 01/26/2019 0823  LATICACIDVEN 1.6 7.7* 9.5*    Recent Results (from the past 240 hour(s))  Novel Coronavirus, NAA (hospital order; send-out to ref lab)     Status: None   Collection Time: 02/15/19  5:26 PM   Specimen: Nasopharyngeal Swab; Respiratory  Result Value Ref Range Status   SARS-CoV-2, NAA NOT DETECTED NOT DETECTED Final    Comment: (NOTE) This test was developed and its performance characteristics determined by [facility]. This test has not been FDA cleared or approved. This test has been authorized by FDA under an Emergency Use Authorization (EUA). This test is only authorized for the duration  of time the declaration that circumstances exist justifying the authorization of the emergency use of in vitro diagnostic tests for detection of SARS-CoV-2 virus and/or diagnosis of COVID-19 infection under section 564(b)(1) of the Act, 21 U.S.C. 161WRU-0(A)(5360bbb-3(b)(1), unless the authorization is terminated or revoked sooner. When diagnostic testing is negative, the possibility of a false negative result should be considered in the context of a patient's recent exposures and the presence of clinical signs and symptoms consistent with COVID-19. An individual without symptoms of COVID-19 and who is not shedding SARS-CoV-2 virus would expect to have a negative (not detected) result in this assay. Performed  At: [facility] [address] [place], [place] [id number] Jolene SchimkeNagendra Sanjai MD NW:[id number]:[phone]    Coronavirus Source NASOPHARYNGEAL  Final    Comment: Performed at [facility], [address] [place], [place]  [address]  Culture, Urine     Status: None   Collection Time: 02/18/19  7:50 AM   Specimen: Urine, Clean Catch  Result Value Ref Range Status   Specimen Description   Final    URINE, CLEAN CATCH Performed at [facility], [address] [place], [place] [address]    Special Requests   Final    NONE Performed at [facility], [address] [place], [place] [address]    Culture   Final    NO GROWTH Performed at [facility], [address] [place], [place] [address]    Report Status 02/19/2019 FINAL  Final         Radiology Studies: Ct Head Wo Contrast  Result Date: 16-Oct-2018 CLINICAL DATA:  Unexplained altered level consciousness. EXAM: CT HEAD WITHOUT CONTRAST TECHNIQUE: Contiguous axial images were obtained from the base of the skull through the vertex without intravenous contrast. COMPARISON:  02/11/2019 FINDINGS: Brain: No evidence of acute infarction, hemorrhage, hydrocephalus, extra-axial collection, or mass lesion/mass effect. Moderate diffuse cerebral atrophy and severe chronic small vessel disease are stable in appearance. Old lacunar infarcts again seen involving the right thalamus and left external capsule and centrum semiovale. Vascular:  No hyperdense vessel or other acute findings. Skull: No evidence of fracture or other significant bone abnormality. Sinuses/Orbits:  No acute findings. Other: None. IMPRESSION: 1. No acute intracranial abnormality. 2. Stable cerebral atrophy, chronic small vessel disease, and old lacunar infarcts. Electronically Signed   By: Danae OrleansJohn A Stahl M.D.   On: 022-Mar-2020 05:53   Dg Chest Port 1 View  Result Date: 16-Oct-2018 CLINICAL DATA:  Dyspnea. EXAM: PORTABLE CHEST 1 VIEW 8:23 a.m. COMPARISON:  022-Mar-2020 at 6 a.m. and 02/13/2019 and 02/12/2019 FINDINGS: Heart size and vascularity are normal. Tortuosity and calcification of the thoracic aorta. Lungs are now clear. No effusions. No acute bone abnormality.  IMPRESSION: No acute cardiopulmonary disease. Clearing of the slight atelectasis. Aortic atherosclerosis. Electronically Signed   By: Francene BoyersJames  Maxwell M.D.   On: 022-Mar-2020 09:00   Dg Chest Port 1 View  Result Date: 16-Oct-2018 CLINICAL DATA:  Fever.  Leukocytosis. EXAM: PORTABLE CHEST 1 VIEW COMPARISON:  02/13/2019. FINDINGS: Mediastinum hilar structures normal. Heart size normal. Interval clearing of mild bilateral interstitial prominence. Mild residual peribronchial cuffing. Mild right base subsegmental atelectasis. No pleural effusion or pneumothorax. Chest is stable prior exam. IMPRESSION: Interval clearing of mild bilateral interstitial prominence. Mild peribronchial cuffing again noted suggesting the possibility of bronchitis. Low lung volumes with mild right base subsegmental atelectasis. No focal alveolar infiltrate. Electronically Signed   By: Maisie Fushomas  Register   On: 022-Mar-2020  06:35        Scheduled Meds: . bisacodyl  10 mg Rectal Daily  . chlorhexidine  15 mL Mouth Rinse BID  . guaiFENesin  1,200 mg Oral BID  . insulin aspart  0-5 Units Subcutaneous QHS  . insulin aspart  0-9 Units Subcutaneous TID WC  . magnesium oxide  400 mg Oral QODAY  . mouth rinse  15 mL Mouth Rinse q12n4p  . sodium chloride flush  10-40 mL Intracatheter Q12H   Continuous Infusions: . lactated ringers 100 mL/hr at 01/28/2019 0704  . levETIRAcetam 500 mg (02/10/2019 0740)  . sodium chloride       LOS: 13 days        Glade LloydKshitiz Gurkaran Rahm, MD [facility] 01/25/2019, 9:51 AM

## 2019-02-25 NOTE — Death Summary Note (Signed)
Death Summary  Stephen Hester. WUJ:[id number] DOB: Jul 17, 1929 DOA: 02/17/19  PCP: [facility]  Admit date: 2019-02-17 Date of Death: 03/03/2019 Time of Death: 11-Dec-2203   History of present illness:  85 year old male with history of diabetes, dementia, hypertension, A. fib on Eliquis presented on Feb 17, 2027 unwitnessed fall and altered mental status at this facility and was found to have acute metabolic encephalopathy related to hypernatremia/AKI in setting of dehydration and pneumonia.  Patient was transferred to [facility] on 725 due to concern for seizure-like activity and worsening mental status.  He was started on Keppra.  Neurology was consulted.  He was transferred out of neuro ICU on 02/18/2019.  Patient has remained lethargic and unresponsive.  Early in the morning of 03/03/19, he was found to be even less responsive with hypotension and hypoxia with seizure-like activity.  Patient's condition did not improve and family decided to pursue comfort measures.  Patient expired on 2019/03/03 at 2203-12-11.   Final Diagnoses:   Acute hypoxic respiratory failure Hypotension Seizures Left-sided pneumonia, present on admission Paroxysmal A. Fib Hypovolemic hyponatremia AKI with metabolic acidosis Diabetes mellitus type 2 Thrombocytopenia Hyperlipidemia Normocytic anemia Elevated troponin  The results of significant diagnostics from this hospitalization (including imaging, microbiology, ancillary and laboratory) are listed below for reference.    Significant Diagnostic Studies: Ct Head Wo Contrast  Result Date: 2019/03/03 CLINICAL DATA:  Unexplained altered level consciousness. EXAM: CT HEAD WITHOUT CONTRAST TECHNIQUE: Contiguous axial images were obtained from the base of the skull through the vertex without intravenous contrast. COMPARISON:  February 17, 2019 FINDINGS: Brain: No evidence of acute infarction, hemorrhage, hydrocephalus, extra-axial collection, or mass lesion/mass effect.  Moderate diffuse cerebral atrophy and severe chronic small vessel disease are stable in appearance. Old lacunar infarcts again seen involving the right thalamus and left external capsule and centrum semiovale. Vascular:  No hyperdense vessel or other acute findings. Skull: No evidence of fracture or other significant bone abnormality. Sinuses/Orbits:  No acute findings. Other: None. IMPRESSION: 1. No acute intracranial abnormality. 2. Stable cerebral atrophy, chronic small vessel disease, and old lacunar infarcts. Electronically Signed   By: Danae [place] M.D.   On: 2019/03/03 05:53   Ct Head Wo Contrast  Result Date: 02-17-2019 CLINICAL DATA:  Unwitnessed fall.  Altered mental status. EXAM: CT HEAD WITHOUT CONTRAST CT CERVICAL SPINE WITHOUT CONTRAST TECHNIQUE: Multidetector CT imaging of the head and cervical spine was performed following the standard protocol without intravenous contrast. Multiplanar CT image reconstructions of the cervical spine were also generated. COMPARISON:  None. FINDINGS: CT HEAD FINDINGS Brain: Mild diffuse cortical atrophy is noted. Mild chronic ischemic white matter disease is noted. 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 No mass effect or midline shift is noted. Ventricular size is within normal limits. There is no evidence of mass lesion, hemorrhage or acute infarction. Vascular: No hyperdense vessel or unexpected calcification. Skull: Normal. Negative for fracture or focal lesion. Sinuses/Orbits: No acute finding. Other: None. CT CERVICAL SPINE FINDINGS Alignment: Normal. Skull base and vertebrae: No acute fracture. No primary bone lesion or focal pathologic process. Soft tissues and spinal canal: No prevertebral fluid or swelling. No visible canal hematoma. Disc levels: Severe degenerative disc disease is noted at C3-4, C4-5, C5-6, C6-7 and C7-T1 with anterior osteophyte formation. Upper chest: Negative. Other: None. IMPRESSION: Mild diffuse cortical atrophy. Mild chronic  ischemic white matter disease. No acute intracranial abnormality seen. Severe multilevel degenerative disc disease. No acute abnormality seen in the cervical spine. Electronically Signed   By: Fayrene Fearing  Christen ButterGreen Jr M.D.   On: 10/16/2018 13:46   Ct Cervical Spine Wo Contrast  Result Date: 2018-12-02 CLINICAL DATA:  Unwitnessed fall.  Altered mental status. EXAM: CT HEAD WITHOUT CONTRAST CT CERVICAL SPINE WITHOUT CONTRAST TECHNIQUE: Multidetector CT imaging of the head and cervical spine was performed following the standard protocol without intravenous contrast. Multiplanar CT image reconstructions of the cervical spine were also generated. COMPARISON:  None. FINDINGS: CT HEAD FINDINGS Brain: Mild diffuse cortical atrophy is noted. Mild chronic ischemic white matter disease is noted. 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 No mass effect or midline shift is noted. Ventricular size is within normal limits. There is no evidence of mass lesion, hemorrhage or acute infarction. Vascular: No hyperdense vessel or unexpected calcification. Skull: Normal. Negative for fracture or focal lesion. Sinuses/Orbits: No acute finding. Other: None. CT CERVICAL SPINE FINDINGS Alignment: Normal. Skull base and vertebrae: No acute fracture. No primary bone lesion or focal pathologic process. Soft tissues and spinal canal: No prevertebral fluid or swelling. No visible canal hematoma. Disc levels: Severe degenerative disc disease is noted at C3-4, C4-5, C5-6, C6-7 and C7-T1 with anterior osteophyte formation. Upper chest: Negative. Other: None. IMPRESSION: Mild diffuse cortical atrophy. Mild chronic ischemic white matter disease. No acute intracranial abnormality seen. Severe multilevel degenerative disc disease. No acute abnormality seen in the cervical spine. Electronically Signed   By: Lupita RaiderJames  Green Jr M.D.   On: 10/16/2018 13:46   Mr Brain Wo Contrast  Result Date: 02/16/2019 CLINICAL DATA:  Encephalopathy EXAM: MRI HEAD WITHOUT  CONTRAST TECHNIQUE: Multiplanar, multiecho pulse sequences of the brain and surrounding structures were obtained without intravenous contrast. COMPARISON:  head CT 10/16/2018 FINDINGS: BRAIN: There is no acute infarct, acute hemorrhage or extra-axial collection. Diffuse confluent hyperintense T2-weighted signal within the periventricular, deep and juxtacortical white matter, most commonly due to chronic ischemic microangiopathy. There is generalized atrophy without lobar predilection. Susceptibility-sensitive sequences show no chronic microhemorrhage or superficial siderosis. The midline structures are normal. There is no midline shift or mass effect. 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 VASCULAR: The major intracranial arterial and venous sinus flow voids are normal. SKULL AND UPPER CERVICAL SPINE: Calvarial bone marrow signal is normal. There is no skull base mass. The visualized upper cervical spine and soft tissues are normal. SINUSES/ORBITS: There are no fluid levels or advanced mucosal thickening. The mastoid air cells and middle ear cavities are free of fluid. The orbits are normal. IMPRESSION: Chronic ischemic microangiopathy and generalized atrophy without acute abnormality. Electronically Signed   By: Deatra RobinsonKevin  Herman M.D.   On: 02/16/2019 21:27   [place] Renal  Result Date: 02/13/2019 CLINICAL DATA:  Acute renal injury EXAM: RENAL / URINARY TRACT ULTRASOUND COMPLETE COMPARISON:  None. FINDINGS: Right Kidney: Renal measurements: 10.3 x 5.3 x 5.5 cm. = volume: 157 mL. 8 mm subcortical cyst is noted. No obstructive changes are seen Left Kidney: Renal measurements: 10.3 x 4.8 x 4.9 cm. = volume: 128 mL. Echogenicity within normal limits. No mass or hydronephrosis visualized. Bladder: Appears normal for degree of bladder distention. IMPRESSION: Tiny right renal cyst. No obstructive changes are noted. Electronically Signed   By: Alcide CleverMark  Lukens M.D.   On: 02/13/2019 20:50    Dg Chest Port 1 View  Result Date: 02/13/2019 CLINICAL DATA:  Dyspnea. EXAM: PORTABLE CHEST 1 VIEW 8:23 a.m. COMPARISON:  02/15/2019 at 6 a.m. and 02/13/2019 and 02/12/2019 FINDINGS: Heart size and vascularity are normal. Tortuosity and calcification of the thoracic  aorta. Lungs are now clear. No effusions. No acute bone abnormality. IMPRESSION: No acute cardiopulmonary disease. Clearing of the slight atelectasis. Aortic atherosclerosis. Electronically Signed   By: Francene BoyersJames  Maxwell M.D.   On: 01/26/2019 09:00   Dg Chest Port 1 View  Result Date: 01/31/2019 CLINICAL DATA:  Fever.  Leukocytosis. EXAM: PORTABLE CHEST 1 VIEW COMPARISON:  02/13/2019. FINDINGS: Mediastinum hilar structures normal. Heart size normal. Interval clearing of mild bilateral interstitial prominence. Mild residual peribronchial cuffing. Mild right base subsegmental atelectasis. No pleural effusion or pneumothorax. Chest is stable prior exam. IMPRESSION: Interval clearing of mild bilateral interstitial prominence. Mild peribronchial cuffing again noted suggesting the possibility of bronchitis. Low lung volumes with mild right base subsegmental atelectasis. No focal alveolar infiltrate. Electronically Signed   By: Maisie Fushomas  Register   On: 02/15/2019 06:35   Dg Chest Port 1 View  Result Date: 02/13/2019 CLINICAL DATA:  Shortness of breath. EXAM: PORTABLE CHEST 1 VIEW COMPARISON:  02/12/2019. FINDINGS: Stable cardiomegaly. No pulmonary venous congestion. Mild peribronchial cuffing and bilateral interstitial prominence. These changes may be chronic. Active bronchitis/mild pneumonitis cannot be excluded. Mild bibasilar atelectasis and or scarring. No pleural effusion or pneumothorax. IMPRESSION: 1.  Stable cardiomegaly. 2. Mild peribronchial cuffing and bilateral perihilar interstitial prominence. These changes may be chronic. Active bronchitis/mild pneumonitis cannot be excluded. Electronically Signed   By: Maisie Fushomas  Register   On: 02/13/2019  06:30   Dg Chest Port 1 View  Result Date: 02/12/2019 CLINICAL DATA:  Shortness of breath. EXAM: PORTABLE CHEST 1 VIEW COMPARISON:  Chest x-rays dated 02/11/2019 and March 18, 2019. FINDINGS: Study is hypoinspiratory with crowding of the perihilar bronchovascular markings. Given the low lung volumes, lungs appear clear. No pleural effusion or pneumothorax seen. Heart size and mediastinal contours are stable. Osseous structures about the chest are unremarkable. IMPRESSION: Low lung volumes. No active disease. No evidence of pneumonia or pulmonary edema. Electronically Signed   By: Bary RichardStan  Maynard M.D.   On: 02/12/2019 04:25   Dg Chest Port 1 View  Result Date: 02/11/2019 CLINICAL DATA:  Wheezing EXAM: PORTABLE CHEST 1 VIEW COMPARISON:  March 18, 2019 FINDINGS: Left ventricular prominence. Aortic atherosclerosis. New development of patchy density in the mid and lower lungs bilaterally. Findings could represent developing pneumonia or possibly heart failure. Pneumonia is favored. No visible effusion. Bony structures unremarkable. IMPRESSION: Developing patchy density in the mid and lower lungs left worse than right most consistent with pneumonia. Electronically Signed   By: Paulina FusiMark  Shogry M.D.   On: 02/11/2019 10:39   Dg Chest Portable 1 View  Result Date: November 15, 2018 CLINICAL DATA:  Unwitnessed fall.  Altered mental status. EXAM: PORTABLE CHEST 1 VIEW COMPARISON:  None. FINDINGS: The heart size and mediastinal contours are within normal limits. Both lungs are clear. The visualized skeletal structures are unremarkable. IMPRESSION: No active disease. Electronically Signed   By: Duanne GuessNicholas  Plundo M.D.   On: March 18, 2019 15:09    Microbiology: Recent Results (from the past 240 hour(s))  Novel Coronavirus, NAA (hospital order; send-out to ref lab)     Status: None   Collection Time: 02/15/19  5:26 PM   Specimen: Nasopharyngeal Swab; Respiratory  Result Value Ref Range Status   SARS-CoV-2, NAA NOT DETECTED NOT DETECTED  Final    Comment: (NOTE) This test was developed and its performance characteristics determined by [facility]. This test has not been FDA cleared or approved. This test has been authorized by FDA under an Emergency Use Authorization (EUA). This test is only authorized for the duration of  time the declaration that circumstances exist justifying the authorization of the emergency use of in vitro diagnostic tests for detection of SARS-CoV-2 virus and/or diagnosis of COVID-19 infection under section 564(b)(1) of the Act, 21 U.S.C. 161WRU-0(A)(5360bbb-3(b)(1), unless the authorization is terminated or revoked sooner. When diagnostic testing is negative, the possibility of a false negative result should be considered in the context of a patient's recent exposures and the presence of clinical signs and symptoms consistent with COVID-19. An individual without symptoms of COVID-19 and who is not shedding SARS-CoV-2 virus would expect to have a negative (not detected) result in this assay. Performed  At: [facility] [address] [place], [place] [id number] Jolene SchimkeNagendra Sanjai MD NW:[id number]:[phone]    Coronavirus Source NASOPHARYNGEAL  Final    Comment: Performed at [facility], [address] [place], [place] [address]  Culture, Urine     Status: None   Collection Time: 02/18/19  7:50 AM   Specimen: Urine, Clean Catch  Result Value Ref Range Status   Specimen Description   Final    URINE, CLEAN CATCH Performed at [facility], [address] [place], [place] [address]    Special Requests   Final    NONE Performed at [facility], [address] [place], [place] [address]    Culture   Final    NO GROWTH Performed at [facility], [address] [place], [place] [address]    Report Status 02/19/2019 FINAL  Final     Labs: Basic Metabolic Panel: Recent Labs  Lab 02/18/19 [phone] 02/19/19 0812 02/20/19 0947  02/20/19 1437 02/21/19 0809 02/21/19 1551 02/15/2019 0342  NA 142 147* 149*  --  142 143 144  K 3.9 3.7 3.3*  --  3.7 4.2 4.6  CL 110 112* 115*  --  109 109 113*  CO2 23 23 25   --  25 25 16*  GLUCOSE 331* 154* 104*  --  261* 243* 317*  BUN 33* 34* 29*  --  22 22 32*  CREATININE 1.73* 1.92* 1.75*  --  1.63* 1.68* 2.96*  CALCIUM 8.7* 8.8* 8.5*  --  8.4* 8.5* 8.4*  MG 2.0  --   --  1.9  --   --   --    Liver Function Tests: No results for input(s): AST, ALT, ALKPHOS, BILITOT, PROT, ALBUMIN in the last 168 hours. No results for input(s): LIPASE, AMYLASE in the last 168 hours. Recent Labs  Lab 02/17/19 0600  AMMONIA 18   CBC: Recent Labs  Lab 02/17/19 0600 02/18/19 0627 02/19/19 0812 02/20/19 0326 02/21/19 0032 01/25/2019 0342  WBC 5.4 6.3 11.4* 6.6 7.5 11.5*  NEUTROABS 3.9 5.5 8.7*  --   --   --   HGB 10.4* 9.6* 9.5* 8.4* 9.0* 6.9*  HCT 32.3* 29.6* 28.6* 25.1* 26.8* 22.5*  MCV 94.7 94.0 91.4 92.6 93.4 102.3*  PLT 143* 161 163 145* 163 185   Cardiac Enzymes: No results for input(s): CKTOTAL, CKMB, CKMBINDEX, TROPONINI in the last 168 hours. D-Dimer No results for input(s): DDIMER in the last 72 hours. BNP: Invalid input(s): POCBNP CBG: Recent Labs  Lab 02/21/19 0621 02/21/19 1116 02/21/19 1702 02/21/19 2258 02/09/2019 0656  GLUCAP 232* 237* 225* 216* 277*   Anemia work up No results for input(s): VITAMINB12, FOLATE, FERRITIN, TIBC, IRON, RETICCTPCT in the last 72 hours. Urinalysis    Component Value Date/Time   COLORURINE YELLOW 02/18/2019 0750   APPEARANCEUR HAZY (A) 02/18/2019 0750  LABSPEC 1.020 02/18/2019 0750   PHURINE 5.0 02/18/2019 0750   GLUCOSEU >=500 (A) 02/18/2019 0750   HGBUR MODERATE (A) 02/18/2019 0750   BILIRUBINUR NEGATIVE 02/18/2019 0750   KETONESUR 5 (A) 02/18/2019 0750   PROTEINUR 100 (A) 02/18/2019 0750   NITRITE NEGATIVE 02/18/2019 0750   LEUKOCYTESUR NEGATIVE 02/18/2019 0750   Sepsis Labs Invalid input(s): PROCALCITONIN,  WBC,   LACTICIDVEN     SIGNED:  Aline August, MD  [facility] 02/04/2019, 1:32 PM

## 2019-02-25 NOTE — Progress Notes (Signed)
Pt remained lethargic and unresponsive ,only opens eyes to sternal rub and when rRN was performing mouth care on  Vs remained fairly stable BP  Now  106/78 Hr 119 a-fib with multifocal PVC's   Resp .14bpm O@SAT  100 % RA  Medical Personnel on call for [facility] notified. No new orders. RN will continue to monitor pt closely.

## 2019-02-25 NOTE — Significant Event (Signed)
Rapid Response Event Note  Overview: Time Called: 0422 Arrival Time: 0424 Event Type: Neurologic, Hypotension  Pt with decreased neuro status and hypotensive after seizure like activity and ativan given.   Initial Focused Assessment: Pt laying in bed with no response to painful stimuli. HR 103, BP 72/43 , RR 30, spO2 100% on RA, temp 100.2 rectal. Pupils +2/sluggish bilaterally.   Pt had head CT and abg ordered during day shift. RT called to get abg, attempted to call CT  Interventions: LR bolus (ordered prior to my arrival) Abg- 7.46/25/81/18 CT Head- negative Transfuse 1u PRBCs for hgb 6.9 Labs Maintenance IVF   BP improved to 98/59 with bolus. Pt still not responsive  Plan of Care (if not transferred): Continue to monitor pt neuro status and BP. Awaiting type and screen to transfuse PRBC. RN instructed to call with any changes or concerns.   Event Summary: Name of Physician Notified: Baltazar Najjar- NP at [phone]  event ended at  [place]

## 2019-02-25 NOTE — Progress Notes (Addendum)
Pt had seizure like activity lasting 45 seconds per RN. After which, pt was completely unresponsive except to gag and became hypotensive. NP ordered bolus. NP to bedside. S: pt can not participate in ROS secondary to mental status. Per RN, has been mostly unresponsive tonight except for once, he opened his eyes.  O: Very poor appearing elderly male in NAD. BP 66/40 then 77/42 with bolus running. T 100.4 rectally which is new. HR 118. O2 sat 97% on RA. Eyes closed. Does not respond even to noxious stimuli except to gag. Non verbal. Pupils 18mm, equal. Oral mucosa is dry. Card with murmur. Resp-even, unlabored. Abd round, BS hypoactive, soft. No reaction to palpation. LE without edema. Skin warm.  A/P: 1. Unresponsiveness-? Etiology ? Multifactorial including metabolic, seizure, hypotension, ? Sepsis, ? Bleed. NP reviewed chart and this was same as documented on day shift. CT head was ordered before, but not done for some reason. Will get that now. ABG unable to be obtained by RT. Ammonia level was normal on 02/17/2019. **CT neg for acute. ABG later obtained. Results pending.  2. Seizure like activity-neuro saw. EEG unremarkable. On Keppra.  3. Metabolic derrangements-Na back to normal. Creat remains elevated, unsure of pt's baseline. BMP pending now. See #7.  4. Dementia-contributing factor.  5. AF-rate controlled during stay. On Heparin gtt since can not take his Eliquis. However, Hgb came back low, so Heparin stopped now. 6. Anemia-was normocytic, but now with 3 gram drop since yesterday, one must consider bleeding. He is having no BRBPR or melana. No hematemesis. Abd exam is negative, abd soft. TF one unit.  7. AKI-improved since admission. However, new BMP shows creat of 2.96 with elevated BUN. Dehydration. Had bolus. Start maintenance fluids. LR at 100. Had UO earlier in shift.   8. Fever-low BP-check LA.  9. Hypotension-? Post ictal. ? Bleeding. Hypovolemia. Responded well to bolus. Last BP 98/62.   10. New leukocytosis/fever-check CXR.  10. Code status-FULL. Definitely needs to be addressed again. Palliative care saw a few days ago and family still wanted full scope of treatment.  Update: NP called daughter who is the POA and updated her on everything that has occurred during shift. Also, discussed code status and encouraged a change to DNR. She needs to discuss with other family. Wanted to know about speaking with palliative care again. NP will leave note for attending to get palliative involved again.  ABG 7.4, pCO2 25. POs 81, bic rb 18.  CXR, LA still pending.  Update: LA came back at 7.7. Called daughter again and discussed the seriousness of this lab value and that her father would likely not survive this. She stated she spoke with her sisters and they agreed on a DNR.   [facility], NP [facility] Performed by: Gardiner Barefoot   Total critical care time: Start 0440  End 0610      Total 90 minutes.   Critical care time was exclusive of separately billable procedures and treating other patients.  Critical care was necessary to treat or prevent imminent or life-threatening deterioration.  Critical care was time spent personally by me on the following activities: development of treatment plan with patient and/or surrogate as well as nursing, discussions with consultants, evaluation of patient's response to treatment, examination of patient, obtaining history from patient or surrogate, ordering and performing treatments and interventions, ordering and review of laboratory studies, ordering and review of radiographic studies, pulse oximetry and re-evaluation of patient's condition.

## 2019-02-25 NOTE — Significant Event (Signed)
Rapid Response Event Note  Overview: Follow Up   Initial Focused Assessment: I received a call from the primary nurse at 0753, she was concerned the patient was continuing to deteriorate. Per nurse, patient had seized again, was hypoxic and hypotensive. I was with another patient on another floor, I instructed the nurse to placed the patient on NRB 15L for now and page the MD. Patient was transitioned to DNR last night and I asked that nurse call the [facility] MD on call for further orders.   When I arrived at [place], patient was unresponsive, very weak cough/gag, pupils 2 mm reactive/brisk. GCS 3. SBP 60-70s, MAPs 40-50s, HR 100s, 100% on NRB 15L, and RR 14-18. Lung sounds clear in upper fields and diminished in the lower fields. Skin - cool/clammy, poor capillary refill, pulse weak and thready. Blood sugar at 0656 - 277. [facility] MD came to bedside when I arrived.  Primary nurse called the patient's family and asked that they come see the patient. My clinical impression is that patient is showing signs of end - of - life. PCM was consulted as well.   Interventions: -- NS Bolus 500 cc x 1 -- STAT ABG -- 7.47/20.8/373/15 - PC Resp Alkalosis  -- STAT CXR   -- Lactic Acid    Plan of Care: -- Family to come see patient, PCM was consulted, after family sees patient, perhaps further discussions for [place] can be addressed - transition to comfort care ? -- Patient is not protecting his airway - is a DNR - will leave NRB for now until further.  -- Follow with CXR/LA results -- Keep HOB > 30 - Aspiration Precautions   Event Summary:  Call Time [place]  End Time [place]   Sargent Mankey R

## 2019-02-25 NOTE — Progress Notes (Signed)
Family notified of declining condition and asked to come visit the patient.

## 2019-02-25 NOTE — Progress Notes (Signed)
Patient nurse went in to checked on pt  @ 2200  and noted pt unresponsive.  B LE remained cold to touch, BUE slightly warm, no capillary refills , no respiration, no pulses and  no BP, O2SAT not picking up. Pupils fixed and non reactive,. A second RN called in to confirm and  pt pronounce dead by 2 RN.at 12-06-03. Pt was a DNR and on comfort care.

## 2019-02-25 NOTE — Significant Event (Signed)
Rapid Response Event Note  Upon reviewing the chart - transitioned to comfort measures.   Stephen Hester R

## 2019-02-25 NOTE — Progress Notes (Signed)
Stat CT done  Was negative, ABG done see result. Bladder scanned was done and noted 771ml on scan . Straight catheterization done and 600 ml drain out. bp continue to drop . LR boluses 500 ml x 2 given LR started per MD orders. New orders given and carried out.  Pt to be transfuse I unit of PRBC as Hgb dropped to 6.9d/l    When blood is ready.

## 2019-02-25 NOTE — Progress Notes (Signed)
Physician made aware of family requesting comfort measures

## 2019-02-25 NOTE — Progress Notes (Signed)
Chaplain rec'd request from support from Palliative Care.  Chaplain, responding to another urgent matter,  had not documented last interaction with family.  After she left two sisters bedside this morning, one of the sisters' husbands had come in hallway and asked chaplain to return to room because the third sister was at odds with her siblings and husband of one sister hoped the chaplain would ease tension/conflict.    Chaplain returned to room and met with third sister who was bent over her father's bed and not interested in engaging with her or others. Chaplain waited and then introduced herself and said she was there for spiritual support.  Chaplain prayed a second time n with all three sisters present.  .   As of this time, chaplain is aware that the first two sisters are at peace with father's passing but do not wish to be present. The sister present in room now is not acknowledging patient's condition.  Chaplain will check on patient and remaining daughter later today after she addresses another emerging situation on other floor.  Rev. Tamsen Snider Pager [phone]

## 2019-02-25 NOTE — Progress Notes (Signed)
Chaplain visited family bedside. Two daughters, son-in-law, and granddaughter were present for patient in comfort care. Patient has 3rd daughter not present.  Chaplain listened to stories of patient's life, and offered prayer and scripture and blessing. Chaplain will continue to be available if needed. Rev. Tamsen Snider Pager [phone]

## 2019-02-25 NOTE — Plan of Care (Signed)
Patient not progressing toward goals.

## 2019-02-25 DEATH — deceased

## 2019-05-16 LAB — BLOOD GAS, ARTERIAL
Acid-base deficit: 8 mmol/L — ABNORMAL HIGH (ref 0.0–2.0)
Bicarbonate: 15 mmol/L — ABNORMAL LOW (ref 20.0–28.0)
Delivery systems: 1
Drawn by: 548871
O2 Saturation: 99.6 %
Patient temperature: 98.6
pCO2 arterial: 20.8 mmHg — ABNORMAL LOW (ref 32.0–48.0)
pH, Arterial: 7.472 — ABNORMAL HIGH (ref 7.350–7.450)
pO2, Arterial: 373 mmHg — ABNORMAL HIGH (ref 83.0–108.0)

## 2020-10-18 IMAGING — CT CT HEAD WITHOUT CONTRAST
5 of 7 series · 17 of 47 positions shown, 18 images · non-contrast
Comparison: None.

CLINICAL DATA: Unwitnessed fall.  Altered mental status.

EXAM:
CT HEAD WITHOUT CONTRAST
CT CERVICAL SPINE WITHOUT CONTRAST
TECHNIQUE: Multidetector CT imaging of the head and cervical spine was
performed following the standard protocol without intravenous
contrast. Multiplanar CT image reconstructions of the cervical spine
were also generated.

[Series 3: head wo · axial · 0.46mm/px · z∈[-146,-76]mm · 3 of 28 slices shown, 4 images]
[im 7/28  brain]
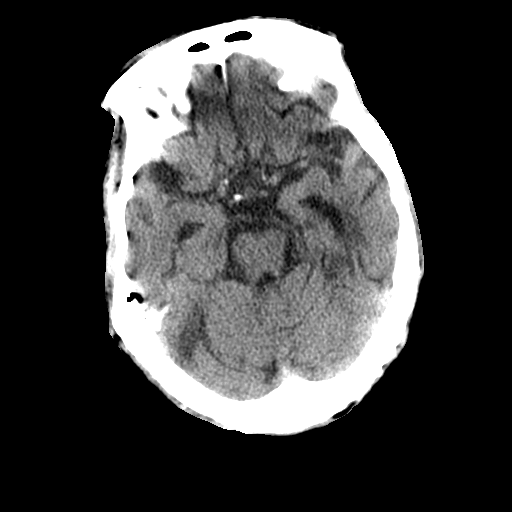
[im 7/28  bone]
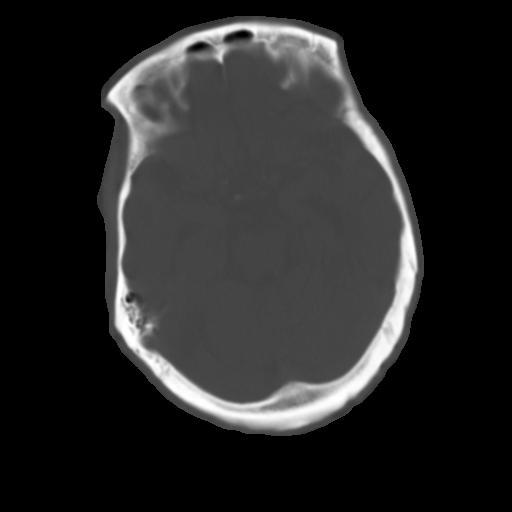
[im 14/28  brain]
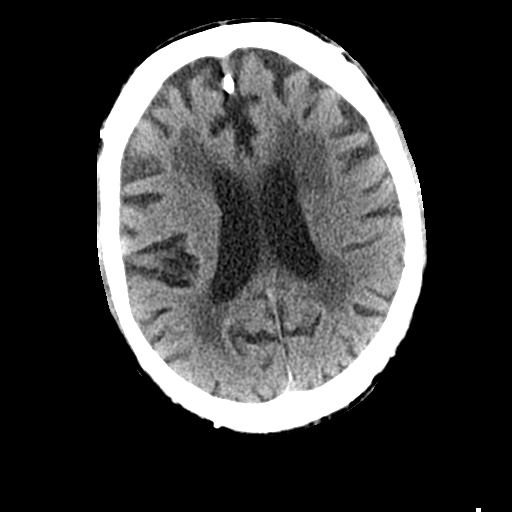
[im 21/28  brain]
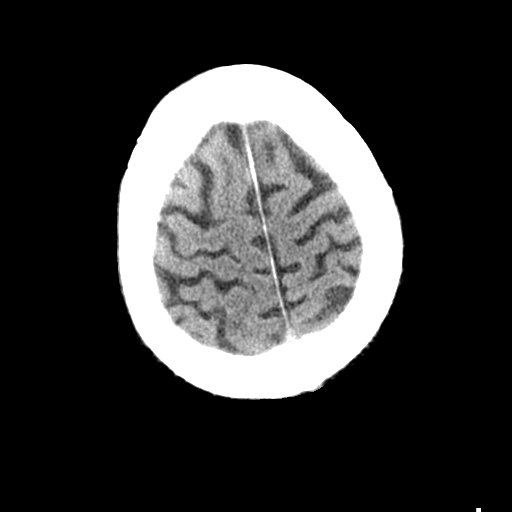

[Series 5: coronal soft tissue · coronal · 0.28mm/px · 3 of 68 slices shown]
[im 23/68  brain]
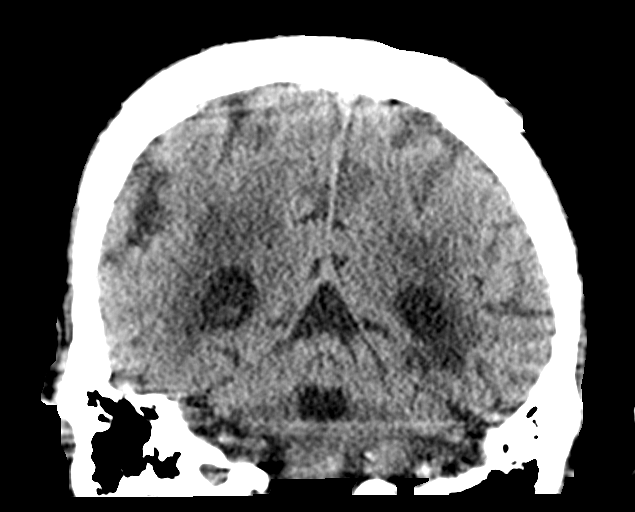
[im 30/68  brain]
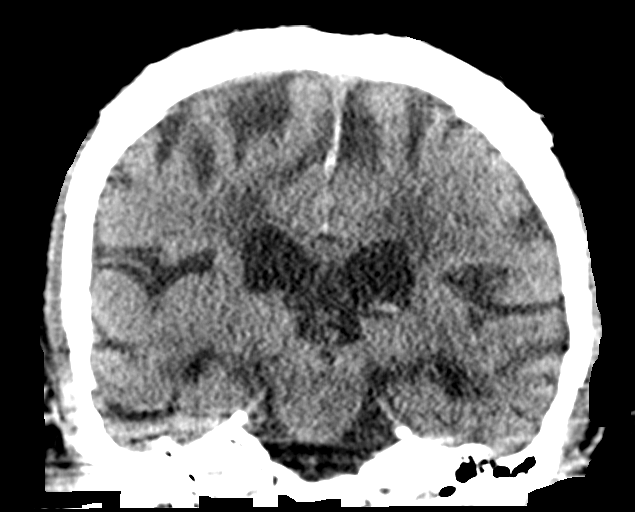
[im 38/68  brain]
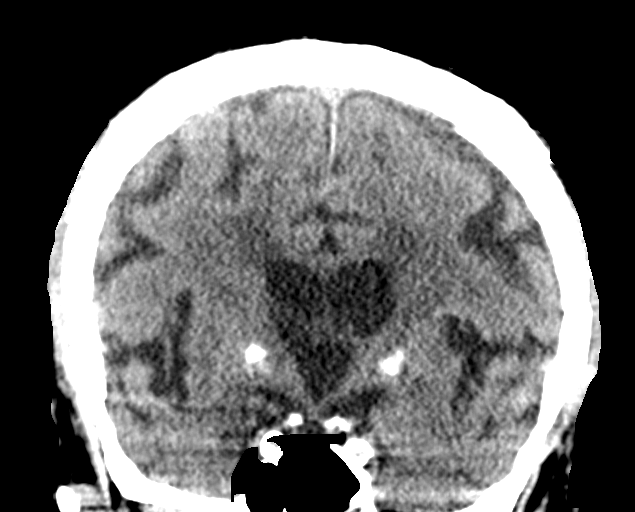

[Series 6: sagittal soft tissue · sagittal · 0.28mm/px · 1 of 58 slices shown]
[im 29/58  brain]
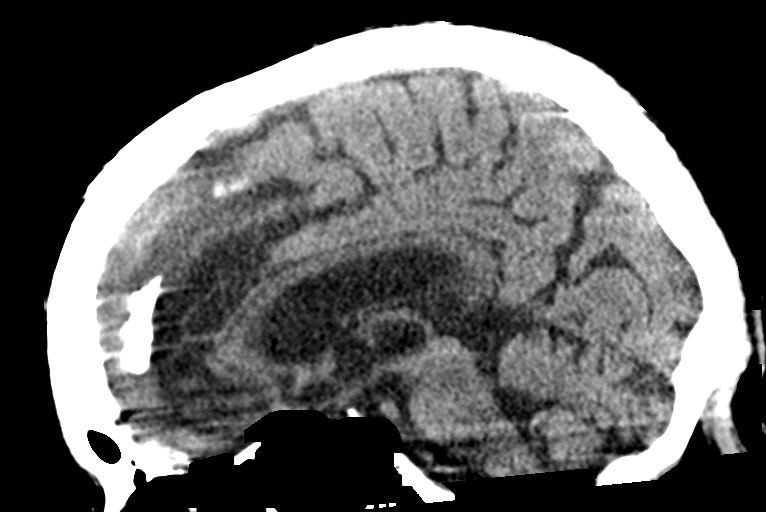

[Series 8: c spine soft · axial · 0.39mm/px · z∈[-288,-166]mm · 8 of 73 slices shown (1 of 2)]
[im 6/73  brain]
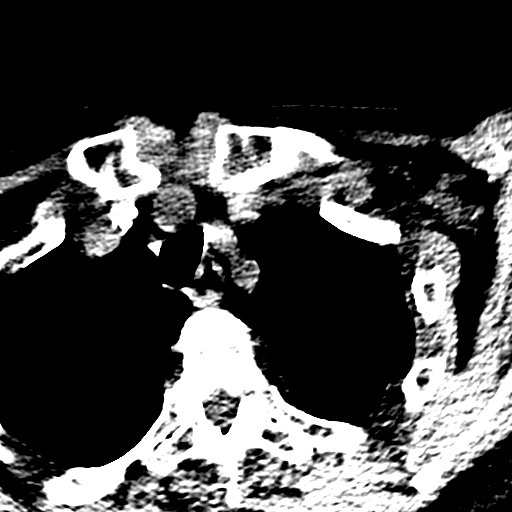
[im 17/73  brain]
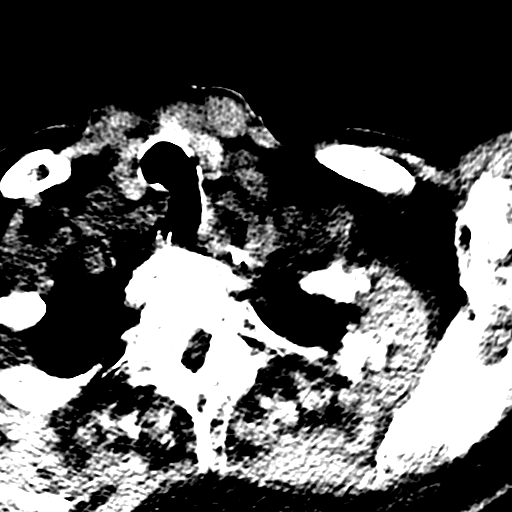
[im 23/73  brain]
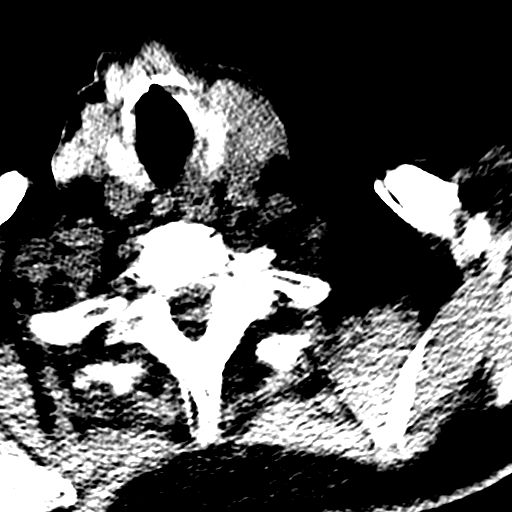
[im 34/73  brain]
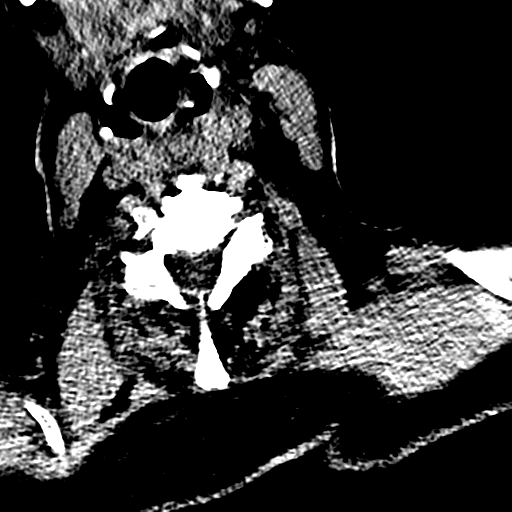
[im 39/73  brain]
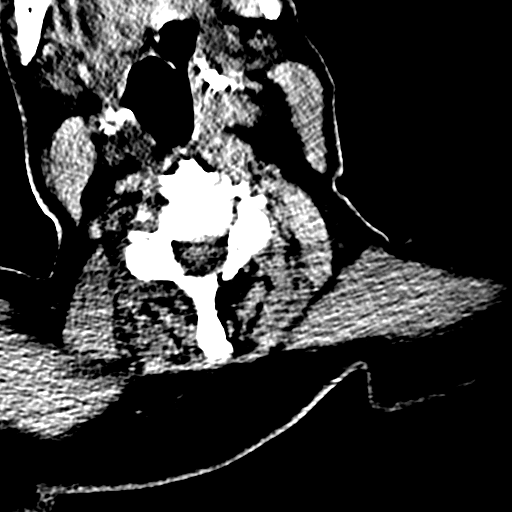
[im 50/73  brain]
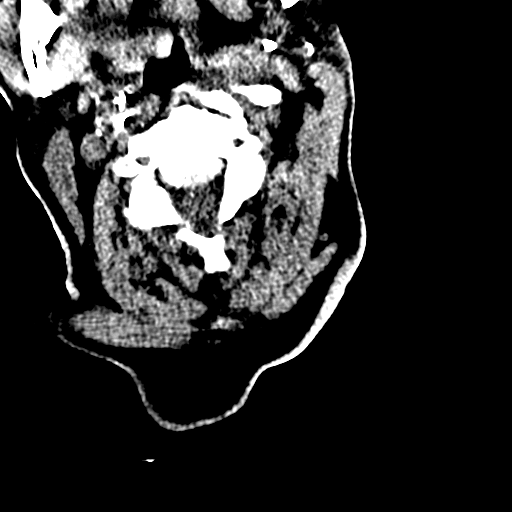
[im 56/73  brain]
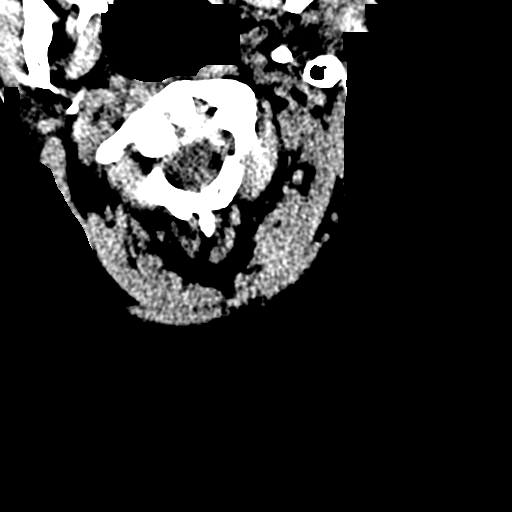
[im 67/73  brain]
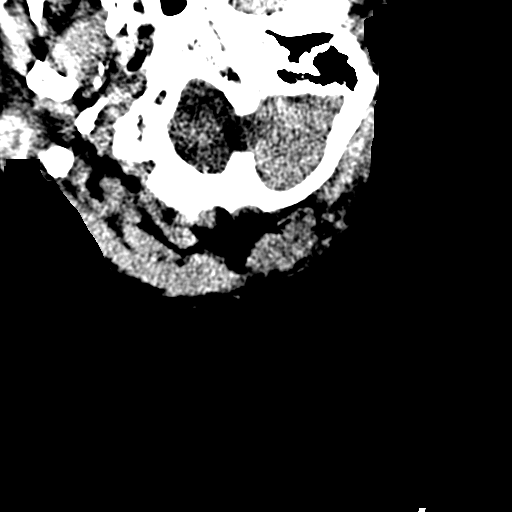

[Series 14: c spine soft · axial · 0.35mm/px · z∈[-262,-250]mm · 2 of 60 slices shown (2 of 2)]
[im 6/60  brain]
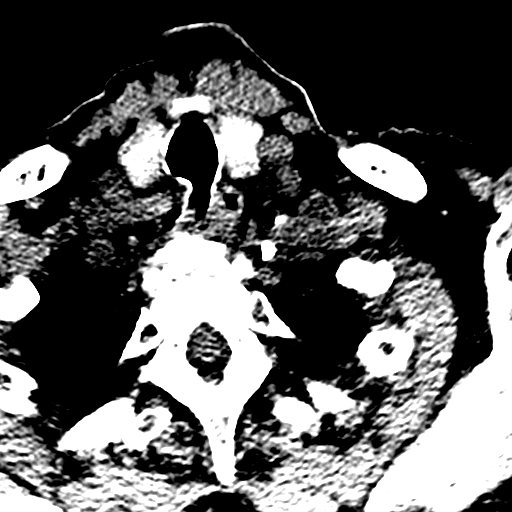
[im 12/60  brain]
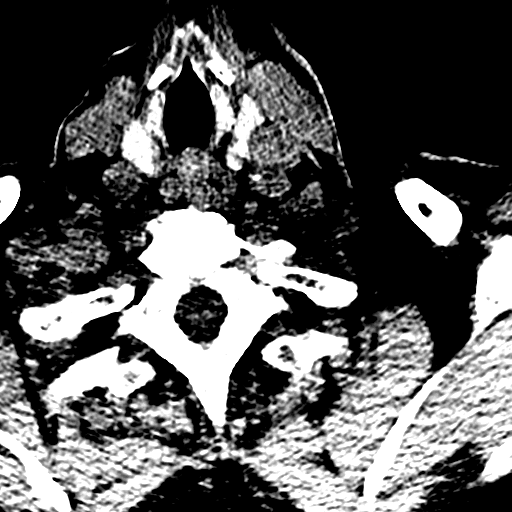

[17 of 47 positions shown; findings below may reference images not displayed]

FINDINGS: CT HEAD FINDINGS

Brain: Mild diffuse cortical atrophy is noted. Mild chronic ischemic
white matter disease is noted. Old lacunar infarctions are noted
bilaterally. No mass effect or midline shift is noted. Ventricular
size is within normal limits. There is no evidence of mass lesion,
hemorrhage or acute infarction.

Vascular: No hyperdense vessel or unexpected calcification.

Skull: Normal. Negative for fracture or focal lesion.

Sinuses/Orbits: No acute finding.

Other: None.

CT CERVICAL SPINE FINDINGS

Alignment: Normal.

Skull base and vertebrae: No acute fracture. No primary bone lesion
or focal pathologic process.

Soft tissues and spinal canal: No prevertebral fluid or swelling. No
visible canal hematoma.

Disc levels: Severe degenerative disc disease is noted at C3-4,
C4-5, C5-6, C6-7 and C7-T1 with anterior osteophyte formation.

Upper chest: Negative.

Other: None.
IMPRESSION: Mild diffuse cortical atrophy. Mild chronic ischemic white matter
disease. No acute intracranial abnormality seen.

Severe multilevel degenerative disc disease. No acute abnormality
seen in the cervical spine.
